# Patient Record
Sex: Female | Born: 1945 | Race: White | Hispanic: No | Marital: Married | Smoking: Former smoker
Health system: Southern US, Community
[De-identification: ages and names within clinical notes are randomized; demographics above are authoritative.]

## PROBLEM LIST (undated history)

## (undated) DIAGNOSIS — E785 Hyperlipidemia, unspecified: Secondary | ICD-10-CM

## (undated) DIAGNOSIS — F1020 Alcohol dependence, uncomplicated: Secondary | ICD-10-CM

## (undated) DIAGNOSIS — E039 Hypothyroidism, unspecified: Secondary | ICD-10-CM

## (undated) DIAGNOSIS — F32A Depression, unspecified: Secondary | ICD-10-CM

## (undated) DIAGNOSIS — I10 Essential (primary) hypertension: Secondary | ICD-10-CM

## (undated) DIAGNOSIS — J4530 Mild persistent asthma, uncomplicated: Secondary | ICD-10-CM

## (undated) DIAGNOSIS — IMO0001 Reserved for inherently not codable concepts without codable children: Secondary | ICD-10-CM

## (undated) DIAGNOSIS — J189 Pneumonia, unspecified organism: Secondary | ICD-10-CM

## (undated) DIAGNOSIS — E663 Overweight: Secondary | ICD-10-CM

## (undated) DIAGNOSIS — F329 Major depressive disorder, single episode, unspecified: Secondary | ICD-10-CM

## (undated) DIAGNOSIS — D509 Iron deficiency anemia, unspecified: Secondary | ICD-10-CM

## (undated) DIAGNOSIS — F419 Anxiety disorder, unspecified: Secondary | ICD-10-CM

## (undated) HISTORY — DX: Overweight: E66.3

## (undated) HISTORY — DX: Mild persistent asthma, uncomplicated: J45.30

## (undated) HISTORY — DX: Iron deficiency anemia, unspecified: D50.9

## (undated) HISTORY — DX: Essential (primary) hypertension: I10

## (undated) HISTORY — DX: Hyperlipidemia, unspecified: E78.5

---

## 1968-11-11 HISTORY — PX: BREAST LUMPECTOMY: SHX2

## 1992-11-11 DIAGNOSIS — J189 Pneumonia, unspecified organism: Secondary | ICD-10-CM

## 1992-11-11 HISTORY — DX: Pneumonia, unspecified organism: J18.9

## 1998-02-27 ENCOUNTER — Encounter: Admission: RE | Admit: 1998-02-27 | Discharge: 1998-02-27 | Payer: Self-pay | Admitting: Family Medicine

## 1998-03-27 ENCOUNTER — Encounter: Admission: RE | Admit: 1998-03-27 | Discharge: 1998-03-27 | Payer: Self-pay | Admitting: Family Medicine

## 1998-04-11 ENCOUNTER — Encounter: Admission: RE | Admit: 1998-04-11 | Discharge: 1998-04-11 | Payer: Self-pay | Admitting: Family Medicine

## 1998-04-14 ENCOUNTER — Encounter: Admission: RE | Admit: 1998-04-14 | Discharge: 1998-04-14 | Payer: Self-pay | Admitting: Family Medicine

## 1998-08-16 ENCOUNTER — Encounter: Admission: RE | Admit: 1998-08-16 | Discharge: 1998-08-16 | Payer: Self-pay | Admitting: Family Medicine

## 1999-01-17 ENCOUNTER — Encounter: Admission: RE | Admit: 1999-01-17 | Discharge: 1999-01-17 | Payer: Self-pay | Admitting: Family Medicine

## 1999-01-26 ENCOUNTER — Encounter: Admission: RE | Admit: 1999-01-26 | Discharge: 1999-01-26 | Payer: Self-pay | Admitting: Family Medicine

## 1999-02-20 ENCOUNTER — Encounter: Admission: RE | Admit: 1999-02-20 | Discharge: 1999-02-20 | Payer: Self-pay | Admitting: Family Medicine

## 1999-03-23 ENCOUNTER — Encounter: Admission: RE | Admit: 1999-03-23 | Discharge: 1999-03-23 | Payer: Self-pay | Admitting: Family Medicine

## 1999-05-07 ENCOUNTER — Encounter: Admission: RE | Admit: 1999-05-07 | Discharge: 1999-05-07 | Payer: Self-pay | Admitting: Family Medicine

## 2000-02-26 ENCOUNTER — Encounter: Admission: RE | Admit: 2000-02-26 | Discharge: 2000-02-26 | Payer: Self-pay

## 2000-05-08 ENCOUNTER — Encounter: Admission: RE | Admit: 2000-05-08 | Discharge: 2000-05-08 | Payer: Self-pay | Admitting: Family Medicine

## 2000-09-17 ENCOUNTER — Encounter: Admission: RE | Admit: 2000-09-17 | Discharge: 2000-09-17 | Payer: Self-pay | Admitting: Family Medicine

## 2000-12-05 ENCOUNTER — Encounter: Admission: RE | Admit: 2000-12-05 | Discharge: 2000-12-05 | Payer: Self-pay | Admitting: Family Medicine

## 2000-12-12 ENCOUNTER — Encounter: Admission: RE | Admit: 2000-12-12 | Discharge: 2000-12-12 | Payer: Self-pay | Admitting: Family Medicine

## 2001-05-28 ENCOUNTER — Encounter: Admission: RE | Admit: 2001-05-28 | Discharge: 2001-05-28 | Payer: Self-pay | Admitting: Family Medicine

## 2001-06-11 ENCOUNTER — Encounter: Admission: RE | Admit: 2001-06-11 | Discharge: 2001-06-11 | Payer: Self-pay | Admitting: Family Medicine

## 2001-07-03 ENCOUNTER — Encounter: Admission: RE | Admit: 2001-07-03 | Discharge: 2001-07-03 | Payer: Self-pay | Admitting: Family Medicine

## 2001-07-06 ENCOUNTER — Encounter: Admission: RE | Admit: 2001-07-06 | Discharge: 2001-07-06 | Payer: Self-pay | Admitting: Family Medicine

## 2002-03-29 ENCOUNTER — Encounter: Admission: RE | Admit: 2002-03-29 | Discharge: 2002-03-29 | Payer: Self-pay | Admitting: Family Medicine

## 2003-08-12 ENCOUNTER — Encounter (INDEPENDENT_AMBULATORY_CARE_PROVIDER_SITE_OTHER): Payer: Self-pay | Admitting: *Deleted

## 2003-08-12 LAB — CONVERTED CEMR LAB

## 2003-08-17 ENCOUNTER — Encounter: Admission: RE | Admit: 2003-08-17 | Discharge: 2003-08-17 | Payer: Self-pay | Admitting: Family Medicine

## 2003-08-22 ENCOUNTER — Encounter: Admission: RE | Admit: 2003-08-22 | Discharge: 2003-08-22 | Payer: Self-pay | Admitting: Sports Medicine

## 2003-08-22 ENCOUNTER — Encounter: Payer: Self-pay | Admitting: Sports Medicine

## 2004-02-06 ENCOUNTER — Encounter: Admission: RE | Admit: 2004-02-06 | Discharge: 2004-02-06 | Payer: Self-pay | Admitting: Family Medicine

## 2005-03-21 ENCOUNTER — Ambulatory Visit: Payer: Self-pay | Admitting: Family Medicine

## 2006-04-04 ENCOUNTER — Ambulatory Visit: Payer: Self-pay | Admitting: Family Medicine

## 2007-01-09 ENCOUNTER — Encounter (INDEPENDENT_AMBULATORY_CARE_PROVIDER_SITE_OTHER): Payer: Self-pay | Admitting: *Deleted

## 2007-04-23 ENCOUNTER — Telehealth: Payer: Self-pay | Admitting: *Deleted

## 2007-05-12 ENCOUNTER — Encounter: Payer: Self-pay | Admitting: Family Medicine

## 2007-05-12 ENCOUNTER — Ambulatory Visit: Payer: Self-pay | Admitting: Family Medicine

## 2007-05-12 DIAGNOSIS — J45909 Unspecified asthma, uncomplicated: Secondary | ICD-10-CM | POA: Insufficient documentation

## 2007-05-12 DIAGNOSIS — I10 Essential (primary) hypertension: Secondary | ICD-10-CM

## 2007-05-12 LAB — CONVERTED CEMR LAB
ALT: 35 units/L (ref 0–35)
CO2: 29 meq/L (ref 19–32)
Cholesterol: 278 mg/dL — ABNORMAL HIGH (ref 0–200)
Creatinine, Ser: 0.76 mg/dL (ref 0.40–1.20)
Glucose, Bld: 95 mg/dL (ref 70–99)
Total Bilirubin: 0.7 mg/dL (ref 0.3–1.2)
Total CHOL/HDL Ratio: 5.1
Triglycerides: 225 mg/dL — ABNORMAL HIGH (ref <150)
VLDL: 45 mg/dL — ABNORMAL HIGH (ref 0–40)

## 2007-05-14 ENCOUNTER — Telehealth: Payer: Self-pay | Admitting: Family Medicine

## 2007-06-03 ENCOUNTER — Ambulatory Visit: Payer: Self-pay | Admitting: Family Medicine

## 2007-06-03 DIAGNOSIS — E785 Hyperlipidemia, unspecified: Secondary | ICD-10-CM | POA: Insufficient documentation

## 2007-08-28 ENCOUNTER — Telehealth: Payer: Self-pay | Admitting: Family Medicine

## 2007-09-02 ENCOUNTER — Ambulatory Visit: Payer: Self-pay | Admitting: Family Medicine

## 2007-09-02 ENCOUNTER — Encounter: Payer: Self-pay | Admitting: Family Medicine

## 2007-09-02 LAB — CONVERTED CEMR LAB
ALT: 29 units/L (ref 0–35)
AST: 34 units/L (ref 0–37)
Albumin: 4.7 g/dL (ref 3.5–5.2)

## 2007-09-03 ENCOUNTER — Encounter: Payer: Self-pay | Admitting: Family Medicine

## 2007-10-16 ENCOUNTER — Ambulatory Visit: Payer: Self-pay | Admitting: Family Medicine

## 2007-10-16 DIAGNOSIS — J309 Allergic rhinitis, unspecified: Secondary | ICD-10-CM | POA: Insufficient documentation

## 2008-05-31 ENCOUNTER — Encounter: Payer: Self-pay | Admitting: Family Medicine

## 2008-05-31 ENCOUNTER — Ambulatory Visit: Payer: Self-pay | Admitting: Family Medicine

## 2008-05-31 LAB — CONVERTED CEMR LAB

## 2008-06-07 ENCOUNTER — Encounter: Payer: Self-pay | Admitting: Family Medicine

## 2008-06-15 ENCOUNTER — Ambulatory Visit: Payer: Self-pay | Admitting: Family Medicine

## 2008-06-15 ENCOUNTER — Encounter: Payer: Self-pay | Admitting: Family Medicine

## 2008-06-15 LAB — CONVERTED CEMR LAB
ALT: 39 units/L — ABNORMAL HIGH (ref 0–35)
AST: 37 units/L (ref 0–37)
Cholesterol: 244 mg/dL — ABNORMAL HIGH (ref 0–200)
Creatinine, Ser: 0.64 mg/dL (ref 0.40–1.20)
Total Bilirubin: 0.4 mg/dL (ref 0.3–1.2)
Total CHOL/HDL Ratio: 3.4
VLDL: 36 mg/dL (ref 0–40)

## 2008-06-20 ENCOUNTER — Telehealth: Payer: Self-pay | Admitting: Family Medicine

## 2008-10-03 ENCOUNTER — Telehealth: Payer: Self-pay | Admitting: *Deleted

## 2009-01-02 ENCOUNTER — Ambulatory Visit: Payer: Self-pay | Admitting: Family Medicine

## 2009-01-02 ENCOUNTER — Encounter: Payer: Self-pay | Admitting: Family Medicine

## 2009-01-04 LAB — CONVERTED CEMR LAB
ALT: 29 units/L (ref 0–35)
Albumin: 4.7 g/dL (ref 3.5–5.2)
Bilirubin, Direct: 0.1 mg/dL (ref 0.0–0.3)
Direct LDL: 134 mg/dL — ABNORMAL HIGH
Total Bilirubin: 0.5 mg/dL (ref 0.3–1.2)

## 2009-03-06 ENCOUNTER — Ambulatory Visit: Payer: Self-pay | Admitting: Family Medicine

## 2009-03-06 ENCOUNTER — Encounter: Admission: RE | Admit: 2009-03-06 | Discharge: 2009-03-06 | Payer: Self-pay | Admitting: Family Medicine

## 2009-03-08 ENCOUNTER — Telehealth: Payer: Self-pay | Admitting: Family Medicine

## 2009-03-08 ENCOUNTER — Encounter: Payer: Self-pay | Admitting: Family Medicine

## 2009-03-08 ENCOUNTER — Ambulatory Visit: Payer: Self-pay | Admitting: Family Medicine

## 2009-03-17 ENCOUNTER — Encounter: Admission: RE | Admit: 2009-03-17 | Discharge: 2009-03-17 | Payer: Self-pay | Admitting: Family Medicine

## 2009-03-21 ENCOUNTER — Encounter: Payer: Self-pay | Admitting: Family Medicine

## 2009-04-11 ENCOUNTER — Encounter: Payer: Self-pay | Admitting: Family Medicine

## 2009-05-09 ENCOUNTER — Encounter: Payer: Self-pay | Admitting: Family Medicine

## 2009-08-15 ENCOUNTER — Telehealth: Payer: Self-pay | Admitting: *Deleted

## 2009-10-16 ENCOUNTER — Encounter: Payer: Self-pay | Admitting: Family Medicine

## 2009-10-16 ENCOUNTER — Ambulatory Visit: Payer: Self-pay | Admitting: Family Medicine

## 2009-10-16 DIAGNOSIS — E663 Overweight: Secondary | ICD-10-CM | POA: Insufficient documentation

## 2009-10-16 LAB — CONVERTED CEMR LAB
ALT: 22 units/L (ref 0–35)
Albumin: 5 g/dL (ref 3.5–5.2)
CO2: 22 meq/L (ref 19–32)
Calcium: 10 mg/dL (ref 8.4–10.5)
Chloride: 101 meq/L (ref 96–112)
Cholesterol: 253 mg/dL — ABNORMAL HIGH (ref 0–200)
Creatinine, Ser: 0.84 mg/dL (ref 0.40–1.20)
HCT: 38.3 % (ref 36.0–46.0)
Platelets: 266 10*3/uL (ref 150–400)
Potassium: 5.9 meq/L — ABNORMAL HIGH (ref 3.5–5.3)
Total CHOL/HDL Ratio: 3.2
Total Protein: 7.7 g/dL (ref 6.0–8.3)
WBC: 7.2 10*3/uL (ref 4.0–10.5)

## 2009-10-17 ENCOUNTER — Telehealth: Payer: Self-pay | Admitting: Family Medicine

## 2010-03-19 ENCOUNTER — Telehealth: Payer: Self-pay | Admitting: Family Medicine

## 2010-08-26 ENCOUNTER — Encounter: Payer: Self-pay | Admitting: Family Medicine

## 2010-10-26 ENCOUNTER — Encounter
Admission: RE | Admit: 2010-10-26 | Discharge: 2010-10-26 | Payer: Self-pay | Source: Home / Self Care | Attending: Family Medicine | Admitting: Family Medicine

## 2010-11-26 ENCOUNTER — Ambulatory Visit: Admission: RE | Admit: 2010-11-26 | Discharge: 2010-11-26 | Payer: Self-pay | Source: Home / Self Care

## 2010-11-26 ENCOUNTER — Encounter: Payer: Self-pay | Admitting: Family Medicine

## 2010-11-26 LAB — CONVERTED CEMR LAB
ALT: 24 units/L (ref 0–35)
Albumin: 4.8 g/dL (ref 3.5–5.2)
CO2: 25 meq/L (ref 19–32)
Calcium: 9.2 mg/dL (ref 8.4–10.5)
Chloride: 102 meq/L (ref 96–112)
Cholesterol: 268 mg/dL — ABNORMAL HIGH (ref 0–200)
Glucose, Bld: 80 mg/dL (ref 70–99)
Platelets: 217 10*3/uL (ref 150–400)
Potassium: 5 meq/L (ref 3.5–5.3)
RBC: 3.58 M/uL — ABNORMAL LOW (ref 3.87–5.11)
Sodium: 137 meq/L (ref 135–145)
Total CHOL/HDL Ratio: 5.1
Total Protein: 7.1 g/dL (ref 6.0–8.3)
Triglycerides: 379 mg/dL — ABNORMAL HIGH (ref <150)
VLDL: 76 mg/dL — ABNORMAL HIGH (ref 0–40)
WBC: 5.2 10*3/uL (ref 4.0–10.5)

## 2010-11-27 ENCOUNTER — Encounter: Payer: Self-pay | Admitting: Family Medicine

## 2010-11-27 DIAGNOSIS — D509 Iron deficiency anemia, unspecified: Secondary | ICD-10-CM | POA: Insufficient documentation

## 2010-12-13 NOTE — Assessment & Plan Note (Signed)
Summary: cpp/eo   Vital Signs:  Patient profile:   65 year old female Height:      60 inches Weight:      128 pounds BMI:     25.09 Temp:     98.3 degrees F oral Pulse rate:   77 / minute BP sitting:   143 / 79  (right arm) Cuff size:   regular  Vitals Entered By: Tessie Fass CMA (November 26, 2010 9:26 AM) CC: CPE Is Patient Diabetic? No Pain Assessment Patient in pain? no        Primary Care Provider:  Kataleyah Carducci MD  CC:  CPE.  History of Present Illness: 65 y/o F here for CPE.   HYPERTENSION Disease Monitoring Blood pressure range: 150s-160s-180s Pt states that BPs are always elevated when she comes to doctor's office, but when she checks at home/store it is always better.  Medications: Lisinopril 40mg , HCTZ 25mg  daily, Coreg 12.5mg   two times a day  Compliance: Lightheadedness: Edema: Chest pain: Dyspnea: Exercise: yes, almost daily  Salt restriction:yes    HYPERLIPIDEMIA: Med: Pravastatin 40mg   Compliance: yes Prob taking med:no Muscle aches:no Abd  pain:no   OBESITY BMI: 26.09   Weight difference since last OV:  -4.8 lbs  Exercise:  yes, walking       How often:     almost everyday  Diet: trying to do low fat diet , eating lots of vegetables   Habits & Providers  Alcohol-Tobacco-Diet     Alcohol drinks/day: <1     Alcohol Counseling: not indicated; patient does not drink     Tobacco Status: quit     Tobacco Counseling: to remain off tobacco products  Exercise-Depression-Behavior     Does Patient Exercise: yes     Exercise Counseling: not indicated; exercise is adequate     Type of exercise: walking      Times/week: 5  Current Medications (verified): 1)  Pravastatin Sodium 40 Mg  Tabs (Pravastatin Sodium) .Marland Kitchen.. 1 Tab By Mouth At Bedtime. 2)  Proair Hfa 108 (90 Base) Mcg/act Aers (Albuterol Sulfate) .... 2 Puffs Inhaled Every 4 Hours As Needed Shortness of Breath.  Dispense Generic. 3)  Coreg 12.5 Mg  Tabs (Carvedilol) .Marland Kitchen.. 1 Tab By Mouth Two  Times A Day. 4)  Lisinopril-Hydrochlorothiazide 20-12.5 Mg  Tabs (Lisinopril-Hydrochlorothiazide) .... 2 Tabs By Mouth Daily 5)  Pulmicort Flexhaler 90 Mcg/act  Aepb (Budesonide) .Marland Kitchen.. 1 Puff Inhaled Two Times A Day Regularly 6)  Multivitamins  Caps (Multiple Vitamin) .Marland Kitchen.. 1 Tab By Mouth Daily 7)  Calcium 1500 Mg Tabs (Calcium Carbonate) .Marland Kitchen.. 1 Tab By Mouth Daily 8)  Fish Oil 1200 Mg Caps (Omega-3 Fatty Acids) .... 2 Tab Daily 9)  Aspirin 81 Mg Chew (Aspirin) .Marland Kitchen.. 1 Tab Daily 10)  Co-Enzyme Q-10 10 Mg Caps (Coenzyme Q10) .Marland Kitchen.. 1 Tab By Mouth Daily  Allergies (verified): 1)  Norvasc  Past History:  Past Medical History: Last updated: 10/16/2009 Recovered alcoholic 1994 HTN Hyperlipidemia Eye doctor Dr Pearlean Brownie ([facility], last appt 09/2009, FINE) Dentist (Dr Lelon Perla, last visit 02/2009, needs crown) Norvasc-->LE edema  Family History: Last updated: 06/03/2007 2Bro:  age 70 and 63 with HTN, F:  Lung CA, died age 61, M:  HTN, living age 74, No colon, breast, or ovarian CA,MI or DM, Sis:  Healthy.  No early CV disease.  Social History: Last updated: 10/16/2009 Lives with husband of 30+ years in [place], one child, 2 grandchildren.  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  H/O heavy EtOH use from 1990-94, now only occassional and under control.  Quit smoking in 1996, never used street drugs.  Healthcare POA: daughter, Rulon Eisenmenger Has Living Will: DNR  Risk Factors: Alcohol Use: <1 (11/26/2010) Exercise: yes (11/26/2010)  Risk Factors: Smoking Status: quit (11/26/2010) Packs/Day: 2.0 (10/16/2009)  Review of Systems       The patient complains of weight loss.  The patient denies anorexia, fever, hoarseness, chest pain, syncope, dyspnea on exertion, peripheral edema, prolonged cough, headaches, abdominal pain, melena, hematochezia, hematuria, incontinence, difficulty walking, unusual weight change, and enlarged lymph  nodes.    Physical Exam  General:  Well-developed,well-nourished,in no acute distress; alert,appropriate and cooperative throughout examination. Vitals reviewed.  Head:  normocephalic and atraumatic.   Mouth:  Oral mucosa and oropharynx without lesions or exudates.  Teeth in good repair. Breasts:  No mass, nodules, thickening, tenderness, bulging, retraction, inflamation, nipple discharge or skin changes noted.   Lungs:  Normal respiratory effort, chest expands symmetrically. Lungs are clear to auscultation, no crackles or wheezes. Heart:  Normal rate and regular rhythm. S1 and S2 normal without gallop, murmur, click, rub or other extra sounds. no bruit.  Abdomen:  Bowel sounds positive,abdomen soft and non-tender without masses, organomegaly or hernias noted. Genitalia:  Normal introitus for age, no external lesions, no vaginal discharge, mucosa pink and moist, no vaginal or cervical lesions, no vaginal atrophy, no friaility or hemorrhage, normal uterus size and position, no adnexal masses or tenderness Msk:  No deformity or scoliosis noted of thoracic or lumbar spine.   Pulses:  +2 bilaterally  Extremities:  No clubbing, cyanosis, edema, or deformity noted with normal full range of motion of all joints.   Neurologic:  No cranial nerve deficits noted. Station and gait are normal. Plantar reflexes are down-going bilaterally. DTRs are symmetrical throughout. Sensory, motor and coordinative functions appear intact. Skin:  Intact without suspicious lesions or rashes Cervical Nodes:  No lymphadenopathy noted Axillary Nodes:  No palpable lymphadenopathy Inguinal Nodes:  No significant adenopathy   Impression & Recommendations:  Problem # 1:  PHYSICAL EXAMINATION (ICD-V70.0) Assessment Unchanged Pt doing well.  No complaints. She declined referral for colonscopy.  Risk and benefits discussed with pt.  Up to date on MMG.  Pap done today.  Discussed last pap likely will be next pap in 3 yrs, and  will then be aged out.   Flu shot given in 09/2010 at pt's work place.   Orders: [facility] - Est  40-64 yrs (11914)  Problem # 2:  HYPERTENSION, BENIGN ESSENTIAL (ICD-401.1) Assessment: Improved BP 143/79  elevated, but improved from previous of 160s-180s, and almost at goal of 140/90.  Will not make changes today.   Her updated medication list for this problem includes:    Coreg 12.5 Mg Tabs (Carvedilol) .Marland Kitchen... 1 tab by mouth two times a day.    Lisinopril-hydrochlorothiazide 20-12.5 Mg Tabs (Lisinopril-hydrochlorothiazide) .Marland Kitchen... 2 tabs by mouth daily  Orders: Comp Met-[facility] ([id number]) CBC-[facility] ([id number]) [facility] - Est  40-64 yrs ([id number])  Problem # 3:  HYPERLIPIDEMIA (ICD-272.4) Assessment: Unchanged Will check FLP today, as pt is fasting.  Last LDL was 155, with goal of <952.  Pt taking pravastatin 40mg , will consider switching to Lipitor (now that it is generic) if LDL is still not at goal.  The following medications were removed from the medication list:    Simvastatin 80 Mg Tabs (Simvastatin) .Marland Kitchen... 1 tab by  mouth at bedtime for cholesterol Her updated medication list for this problem includes:    Pravastatin Sodium 40 Mg Tabs (Pravastatin sodium) .Marland Kitchen... 1 tab by mouth at bedtime.  Orders: Comp Met-[facility] [id number]) Lipid-[facility] [id number]) [facility] - Est  40-64 yrs ([id number])  Complete Medication List: 1)  Pravastatin Sodium 40 Mg Tabs (Pravastatin sodium) .Marland Kitchen.. 1 tab by mouth at bedtime. 2)  Proair Hfa 108 (90 Base) Mcg/act Aers (Albuterol sulfate) .... 2 puffs inhaled every 4 hours as needed shortness of breath.  dispense generic. 3)  Coreg 12.5 Mg Tabs (Carvedilol) .Marland Kitchen.. 1 tab by mouth two times a day. 4)  Lisinopril-hydrochlorothiazide 20-12.5 Mg Tabs (Lisinopril-hydrochlorothiazide) .... 2 tabs by mouth daily 5)  Pulmicort Flexhaler 90 Mcg/act Aepb (Budesonide) .Marland Kitchen.. 1 puff inhaled two times a day regularly 6)  Multivitamins Caps (Multiple vitamin) .Marland Kitchen.. 1 tab by mouth daily 7)  Calcium 1500 Mg  Tabs (Calcium carbonate) .Marland Kitchen.. 1 tab by mouth daily 8)  Fish Oil 1200 Mg Caps (Omega-3 fatty acids) .... 2 tab daily 9)  Aspirin 81 Mg Chew (Aspirin) .Marland Kitchen.. 1 tab daily 10)  Co-enzyme Q-10 10 Mg Caps (Coenzyme q10) .Marland Kitchen.. 1 tab by mouth daily  Other Orders: Pap Smear-[facility] ([id number])  Patient Instructions: 1)  Please schedule a follow-up appointment in 6 months for chronic medical issues. 2)  It is important that you exercise reguarly at least 20 minutes 5 times a week. If you develop chest pain, have severe difficulty breathing, or feel very tired, stop exercising immediately and seek medical attention.  3)  Congratulation on your weight loss.  Prescriptions: PULMICORT FLEXHALER 90 MCG/ACT  AEPB (BUDESONIDE) 1 puff inhaled two times a day regularly  #1 x 2   Entered and Authorized by:   Angeline Slim MD   Signed by:   Angeline Slim MD on 11/26/2010   Method used:   Electronically to        [facility] (retail)       [address]       [place], [place]  [address]       Ph: [phone]       Fax: [phone]   RxID:   [id number] LISINOPRIL-HYDROCHLOROTHIAZIDE 20-12.5 MG  TABS (LISINOPRIL-HYDROCHLOROTHIAZIDE) 2 tabs by mouth daily  #180 x 3   Entered and Authorized by:   Angeline Slim MD   Signed by:   Angeline Slim MD on 11/26/2010   Method used:   Electronically to        [facility] (retail)       [address]       [place], [place]  [address]       Ph: [phone]       Fax: [phone]   RxID:   [id number] COREG 12.5 MG  TABS (CARVEDILOL) 1 tab by mouth two times a day.  #180 x 3   Entered and Authorized by:   Angeline Slim MD   Signed by:   Angeline Slim MD on 11/26/2010   Method used:   Electronically to        [facility] (retail)       [address]       [place], [place]  [address]       Ph: [phone]       Fax: [phone]   RxID:  [id number] PROAIR HFA 108 (90 BASE) MCG/ACT AERS (ALBUTEROL  SULFATE) 2 puffs inhaled every 4 hours as needed shortness of breath.  Dispense generic.  #1 x 2   Entered and Authorized by:   Angeline Slim MD   Signed by:   Angeline Slim MD on 11/26/2010   Method used:   Electronically to        [facility] (retail)       [address]       [place], [place]  [address]       Ph: [phone]       Fax: [phone]   RxID:   [id number] PRAVASTATIN SODIUM 40 MG  TABS (PRAVASTATIN SODIUM) 1 tab by mouth at bedtime.  #90 x 3   Entered and Authorized by:   Angeline Slim MD   Signed by:   Angeline Slim MD on 11/26/2010   Method used:   Electronically to        [facility] (retail)       [address]       [place], [place]  [address]       Ph: [phone]       Fax: [phone]   RxID:   [id number]    Orders Added: 1)  Pap Smear-[facility] [[id number]] 2)  Comp Met-[facility] [[id number]] 3)  CBC-[facility] [[id number]] 4)  Lipid-[facility] [[id number]] 5)  [facility] - Est  40-64 yrs [[id number]]      Prevention & Chronic Care Immunizations   Influenza vaccine: Not documented   Influenza vaccine due: Not Indicated    Tetanus booster: 01/02/2009: given   Tetanus booster due: 01/02/2019    Pneumococcal vaccine: Done.  (12/13/1999)   Pneumococcal vaccine due: None    H. zoster vaccine: Not documented  Colorectal Screening   Hemoccult: Done.  (05/11/2001)   Hemoccult action/deferral: Ordered  (10/16/2009)   Hemoccult due: Not Indicated    Colonoscopy: Not documented   Colonoscopy action/deferral: Refused  (10/16/2009)   Colonoscopy due: 10/16/2009  Other Screening   Pap smear: Normal  (06/07/2008)   Pap smear action/deferral: Deferred-3 yr interval  (10/16/2009)   Pap smear due: 06/08/2011    Mammogram: ASSESSMENT: Negative - BI-RADS 1^MM DIGITAL SCREENING  (10/26/2010)   Mammogram action/deferral: Ordered  (10/16/2009)   Mammogram due: 08/11/2004    DXA bone density scan: Not documented   Smoking status: quit   (11/26/2010)  Lipids   Total Cholesterol: 253  (10/16/2009)   LDL: 155  (10/16/2009)   LDL Direct: 134  (01/02/2009)   HDL: 78  (10/16/2009)   Triglycerides: 98  (10/16/2009)    SGOT (AST): 27  (10/16/2009)   SGPT (ALT): 22  (10/16/2009) CMP ordered    Alkaline phosphatase: 84  (10/16/2009)   Total bilirubin: 0.5  (10/16/2009)    Lipid flowsheet reviewed?: Yes   Progress toward LDL goal: Unchanged  Hypertension   Last Blood Pressure: 143 / 79  (11/26/2010)   Serum creatinine: 0.84  (10/16/2009)   Serum potassium 5.9  (10/16/2009) CMP ordered     Hypertension flowsheet reviewed?: Yes   Progress toward BP goal: Improved  Self-Management Support :   Personal Goals (by the next clinic visit) :      Personal blood pressure goal: 140/90  (11/26/2010)     Personal LDL goal: 130  (10/16/2009)    Hypertension self-management support: Written self-care plan  (10/16/2009)  Lipid self-management support: Written self-care plan  (10/16/2009)      Appended Document: B12 AND FOLATE ADDED B12 AND FOLATE ADDED   Clinical Lists Changes  Problems: Added new problem of UNSPECIFIED IRON DEFICIENCY ANEMIA (ICD-280.9) Orders: Added new Test order of B12-[facility] [phone]) - Signed Added new Test order of Folate-[facility] [id number]) - Signed

## 2010-12-13 NOTE — Progress Notes (Signed)
Summary: refill  Phone Note Refill Request Call back at [facility] Phone [phone] Call back at Work Phone [phone] Message from:  Patient  Refills Requested: Medication #1:  PROAIR HFA 108 (90 BASE) MCG/ACT AERS 2 puffs inhaled every 4 hours as needed shortness of breath.  Dispense generic. [facility] [place]  Initial call taken by: De Nurse,  Mar 19, 2010 3:24 PM    Prescriptions: PROAIR HFA 108 (90 BASE) MCG/ACT AERS (ALBUTEROL SULFATE) 2 puffs inhaled every 4 hours as needed shortness of breath.  Dispense generic.  #1 x 3   Entered and Authorized by:   Angeline Slim MD   Signed by:   Angeline Slim MD on 03/19/2010   Method used:   Electronically to        [facility] (retail)       [address]       [place], [place]  [address]       Ph: [phone]       Fax: [phone]   RxID:   [id number]

## 2010-12-13 NOTE — Miscellaneous (Signed)
Summary: Asthma persistent  Clinical Lists Changes  Problems: Removed problem of OTHER SCREENING MAMMOGRAM (ICD-V76.12) Removed problem of FRACTURE, TOE (ICD-826.0) Removed problem of INJURY OTHER&UNSPECIFIED KNEE LEG ANKLE&FOOT (ICD-959.7) Removed problem of ANTIHYPERLIPIDEMIC USE, LONG TERM (ICD-V58.69) Changed problem from ASTHMA, INTRINSIC NOS (ICD-493.10) to ASTHMA, PERSISTENT (ICD-493.90)

## 2011-02-13 ENCOUNTER — Inpatient Hospital Stay (HOSPITAL_COMMUNITY)
Admission: EM | Admit: 2011-02-13 | Discharge: 2011-02-14 | DRG: 096 | Disposition: A | Discharge status: Home / Self Care | Payer: BC Managed Care – PPO | Source: Other Acute Inpatient Hospital | Attending: Family Medicine | Admitting: Family Medicine

## 2011-02-13 ENCOUNTER — Emergency Department (HOSPITAL_COMMUNITY): Payer: BC Managed Care – PPO

## 2011-02-13 ENCOUNTER — Emergency Department (HOSPITAL_COMMUNITY)
Admission: EM | Admit: 2011-02-13 | Discharge: 2011-02-13 | Disposition: A | Discharge status: Other Acute Inpatient Hospital | Payer: BC Managed Care – PPO | Source: Home / Self Care | Attending: Emergency Medicine | Admitting: Emergency Medicine

## 2011-02-13 ENCOUNTER — Encounter: Payer: Self-pay | Admitting: Family Medicine

## 2011-02-13 DIAGNOSIS — E7800 Pure hypercholesterolemia, unspecified: Secondary | ICD-10-CM | POA: Diagnosis present

## 2011-02-13 DIAGNOSIS — R0902 Hypoxemia: Secondary | ICD-10-CM | POA: Diagnosis present

## 2011-02-13 DIAGNOSIS — Z87891 Personal history of nicotine dependence: Secondary | ICD-10-CM

## 2011-02-13 DIAGNOSIS — E785 Hyperlipidemia, unspecified: Secondary | ICD-10-CM | POA: Diagnosis present

## 2011-02-13 DIAGNOSIS — J069 Acute upper respiratory infection, unspecified: Secondary | ICD-10-CM | POA: Diagnosis present

## 2011-02-13 DIAGNOSIS — I10 Essential (primary) hypertension: Secondary | ICD-10-CM | POA: Diagnosis present

## 2011-02-13 DIAGNOSIS — J45901 Unspecified asthma with (acute) exacerbation: Secondary | ICD-10-CM

## 2011-02-13 DIAGNOSIS — E875 Hyperkalemia: Secondary | ICD-10-CM | POA: Diagnosis present

## 2011-02-13 DIAGNOSIS — Z79899 Other long term (current) drug therapy: Secondary | ICD-10-CM

## 2011-02-13 DIAGNOSIS — D509 Iron deficiency anemia, unspecified: Secondary | ICD-10-CM | POA: Diagnosis present

## 2011-02-13 DIAGNOSIS — R0602 Shortness of breath: Secondary | ICD-10-CM | POA: Insufficient documentation

## 2011-02-13 DIAGNOSIS — B9789 Other viral agents as the cause of diseases classified elsewhere: Secondary | ICD-10-CM | POA: Diagnosis present

## 2011-02-13 DIAGNOSIS — R0682 Tachypnea, not elsewhere classified: Secondary | ICD-10-CM | POA: Insufficient documentation

## 2011-02-13 DIAGNOSIS — Z882 Allergy status to sulfonamides status: Secondary | ICD-10-CM

## 2011-02-13 DIAGNOSIS — J45909 Unspecified asthma, uncomplicated: Secondary | ICD-10-CM | POA: Insufficient documentation

## 2011-02-13 LAB — DIFFERENTIAL
Basophils Relative: 1 % (ref 0–1)
Eosinophils Absolute: 0.6 10*3/uL (ref 0.0–0.7)
Monocytes Absolute: 0.5 10*3/uL (ref 0.1–1.0)
Monocytes Relative: 5 % (ref 3–12)
Neutro Abs: 7.7 10*3/uL (ref 1.7–7.7)

## 2011-02-13 LAB — CBC
Hemoglobin: 11.8 g/dL — ABNORMAL LOW (ref 12.0–15.0)
MCH: 32.3 pg (ref 26.0–34.0)
MCHC: 32.6 g/dL (ref 30.0–36.0)

## 2011-02-13 LAB — BRAIN NATRIURETIC PEPTIDE: Pro B Natriuretic peptide (BNP): 48.3 pg/mL (ref 0.0–100.0)

## 2011-02-13 LAB — BASIC METABOLIC PANEL
BUN: 20 mg/dL (ref 6–23)
Chloride: 99 mEq/L (ref 96–112)
Potassium: 5.6 mEq/L — ABNORMAL HIGH (ref 3.5–5.1)

## 2011-02-13 LAB — D-DIMER, QUANTITATIVE: D-Dimer, Quant: 0.27 ug/mL-FEU (ref 0.00–0.48)

## 2011-02-13 LAB — POCT CARDIAC MARKERS

## 2011-02-13 NOTE — H&P (Signed)
Hospital Admission Note Date: 02/13/2011 at 11:45 PM  Patient name: Sabrina Powell Medical record number: [id number] Date of birth: [birth year] Age: 65 y.o. Gender: female PCP: TA,CAT, MD  Medical Service: FMTS  Attending physician: Dr. Jennette Kettle    Pager: Resident (R2/R3):     Pager: Resident (R1): Dr. Armen Pickup     Pager: [phone]  Chief Complaint: SOB/hypoxia.   History of Present Illness: [name] with known history of intermittent asthma and  previous tobaccos use (2 PPD x 25 years, quit 17 yrs ago) who presents with congestion and SOB x 4 days. The patient describes worsening congestion and SOB following a 2-3 week course of allergic rhinitis symptoms (eye irritation, runny nose, postnasal drip). She states that she started using her albuterol inhaler about every 2 hours yesterday. She usually only uses PRN for chest tightness and SOB. Her last asthma and hospital  exacerbation was in the early Spring 17 years ago. Her last exacerbation was complicated by pneumonia and she required hospitalization. Along with chest tightness and SOB she admits to cough productive of yellow sputum no blood. She denies wheezing,  CP, fever, HA, N/V/D.   ED Course: PT was afebrile, hypertensive (165/83), normal resp rate, normal HR, 100 % on 2 L [place]. She received albuterol 5 mg neb tx en route to ED via EMS. In ED she received Albuterol 10mg /Atrovent 1 mg neb treatment and prednisone 60 mg POx1. She had no wheezing on exam. O2 sats 97-90 on RA. O2 sats dropped to 89 when pt walked.   Current Outpatient Prescriptions  Medication Sig Dispense Refill  . albuterol (PROAIR HFA) 108 (90 BASE) MCG/ACT inhaler 2 puffs inhaled every 4 hours as needed shortness of breath.  Dispense generic.       Marland Kitchen aspirin (ASPIRIN CHILDRENS) 81 MG chewable tablet Chew 81 mg by mouth daily.        . Budesonide (PULMICORT FLEXHALER) 90 MCG/ACT inhaler Inhale 1 puff into the lungs 2 (two) times daily. regularly       . Calcium Carbonate 1500 MG  TABS daily.       . carvedilol (COREG) 12.5 MG tablet Take 12.5 mg by mouth 2 (two) times daily.        Marland Kitchen lisinopril-hydrochlorothiazide (PRINZIDE,ZESTORETIC) 20-12.5 MG per tablet Take 2 tablets by mouth daily.        . Multiple Vitamin (MULTIVITAMIN) capsule Take 1 capsule by mouth daily.        . Omega-3 Fatty Acids (FISH OIL) 1200 MG CAPS Take 2 tabs daily       . Pomegranate Extract 40 % POWD daily.       . pravastatin (PRAVACHOL) 40 MG tablet Take 40 mg by mouth at bedtime.        . simvastatin (ZOCOR) 80 MG tablet Take 80 mg by mouth at bedtime. For cholesterol         Allergies: Amlodipine besylate (LE swelling), Sulfa drugs (rash)  Past Medical History  Diagnosis Date  . HTN (hypertension)   . HLD (hyperlipidemia)   . Overweight   . Allergic rhinitis   . Asthma, mild persistent   . IDA (iron deficiency anemia)    Past Surgical History  Procedure Date  . Breast lumpectomy 1970    benign left breast mass   Family History  Problem Relation Age of Onset  . Hypertension Mother   . Cancer Father    History   Social History  . Marital Status: Married  Spouse Name: N/A    Number of Children: N/A  . Years of Education: N/A   Occupational History  . 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 Smoking status: Former Smoker -- 2.0 packs/day for 25 years    Types: Cigarettes    Quit date: 02/13/1994  . Smokeless tobacco: Not on file  . Alcohol Use: 1.0 oz/week    2 drink(s) per week  . Drug Use: No  . Sexually Active: Not on file   Other Topics Concern  . Not on file   Social History Narrative  . No narrative on file      Review of Systems: Constitutional: neg for fever, weight change, hot/cold flashes  HEENT: as per HPI CV: as per HPI Resp: as per HPI GI: negative GU: negative Endo: negative MSK: negative   Physical Exam: Vitals: T 97.9, BP 166/86, HR 66, RR 19, 93% on RA.   General Appearance:    Alert, cooperative, no  distress, appears stated age  Head:    Normocephalic, without obvious abnormality, atraumatic  Eyes:    Conjunctiva/corneas clear, EOMI     Nose:   Nares normal,  no drainage   Throat:   Lips, mucosa normal  Neck:   Supple, symmetrical, trachea midline, no adenopathy;        Lungs:     Clear to auscultation bilaterally, respirations unlabored  Chest Wall:    No tenderness or deformity   Heart:    Regular rate and rhythm, S1 and S2 normal, no murmur, rub   or gallop     Abdomen:     Soft, non-tender, bowel sounds active all four quadrants,    no masses, no organomegaly        Extremities:   Extremities normal, atraumatic, no cyanosis or edema  Pulses:   2+ and symmetric all extremities  Skin:   Skin color, texture, turgor normal, no rashes or lesions     Neurologic:   CNII-XII intact, normal strength, sensation  throughout    Lab results: Lab Results  Component Value Date   WBC 10.5 02/13/2011   HGB 11.8* 02/13/2011   HCT 36.2 02/13/2011   MCV 99.2 02/13/2011   PLT 211 02/13/2011     Chemistry      Component Value Date/Time   NA 133* 02/13/2011 1409   K 5.6* 02/13/2011 1409   CL 99 02/13/2011 1409   CO2 25 02/13/2011 1409   BUN 20 02/13/2011 1409   CREATININE 1.15 02/13/2011 1409      Component Value Date/Time   CALCIUM 8.8 02/13/2011 1409   ALKPHOS 70 11/26/2010 2149   AST 32 11/26/2010 2149   ALT 24 11/26/2010 2149   BILITOT 0.3 11/26/2010 2149     BNP: 48.3 D-dimer: 0.27 POC CE: WNL Imaging results:  CXR: Biapical pleural parenchymal scarring.  Lungs are otherwise clear. Other results: EKG: NSR, rate 60s.   Assessment & Plan by Problem: [name] with mild persistent  (worse symptoms during fall and spring) who presents with SOB and hypoxia.  1. SOB-secondary to asthma exacerbation due to viral URI/allergies. Also considered cardiac cause but unlikely given pt's lack of CP, normal EKG, CXR, BNP and POC CE. Will not cycle CE as pt has no CP or history of CAD. Plan to continue  continuous pulse ox to determine if pt requires supplemental O2 overnight. Will give Albuterol 5 mg INH with spacer, first  now (althought pt lung exam is clear now 10 hrs post-treatment). Will continue Prednisone for a 5 days course. Will continue pulmicort if pt through most of tomorrow. Will start oral PPI for GI prophylaxis since  Continuing steroids.   2. HTN- Pt with know HTN with elevated SBP (140-190s) during past clinic visits. Plan to continue home carvedilol and prinzide.   3. FEN/GI- Pt with slightly low Na, and slightly elevated K+. Has also had slightly elevated K+ in past (5 - 5.9). Unclear etiology. ? drug induced from ACE. Since the pt's EKG is normal and her potassium is within her normal range. WIll continue ACE. No need to repeat BMET. Regular diet.   4. DVT PPX- Heparin 5000 U SQ TID.   5. Dispo- anticipate short hospital course, if patient remains clinically stable. Plan to DC to home.

## 2011-02-14 ENCOUNTER — Encounter: Payer: Self-pay | Admitting: Family Medicine

## 2011-02-14 LAB — CARDIAC PANEL(CRET KIN+CKTOT+MB+TROPI)
CK, MB: 3.6 ng/mL (ref 0.3–4.0)
Relative Index: 2.8 — ABNORMAL HIGH (ref 0.0–2.5)
Troponin I: 0.02 ng/mL (ref 0.00–0.06)

## 2011-02-14 LAB — BASIC METABOLIC PANEL
BUN: 22 mg/dL (ref 6–23)
Calcium: 8.6 mg/dL (ref 8.4–10.5)
Creatinine, Ser: 1.2 mg/dL (ref 0.4–1.2)
GFR calc non Af Amer: 46 mL/min — ABNORMAL LOW (ref >60)

## 2011-02-19 ENCOUNTER — Ambulatory Visit (INDEPENDENT_AMBULATORY_CARE_PROVIDER_SITE_OTHER): Payer: BC Managed Care – PPO | Admitting: Family Medicine

## 2011-02-19 ENCOUNTER — Encounter: Payer: Self-pay | Admitting: Family Medicine

## 2011-02-19 VITALS — BP 179/90 | HR 97 | Temp 98.3°F | Ht 59.5 in | Wt 132.0 lb

## 2011-02-19 DIAGNOSIS — J309 Allergic rhinitis, unspecified: Secondary | ICD-10-CM

## 2011-02-19 MED ORDER — FLUTICASONE PROPIONATE 50 MCG/ACT NA SUSP
1.0000 | Freq: Every day | NASAL | Status: DC
Start: 2011-02-19 — End: 2011-12-23

## 2011-02-19 NOTE — Assessment & Plan Note (Addendum)
Symptoms of cough that is worse at night and watery eyes + rhinorrhea likely post nasal drip.  Pt already on zyrtec.  Will add Flonase.  No wheezing on exam.  **note: pt to call me on Fri or Mon if not feeling better and I may start her on atbx.  She is agreeable to plan.

## 2011-02-19 NOTE — Progress Notes (Signed)
  Subjective:    Patient ID: Sabrina Powell, female    DOB: Jun 23, 1946, 65 y.o.   MRN: [id number]  HPI Pt is here for hosp f/u for Asthma exacerbation.  The last time she had an exacerbation was 18 yrs ago.  She was sent home with total of 7 days of Prednisone (today is last day).  This past weekend she was at the beach and it was windy and now pt feels that she has bronchitis.  +cough with yellow sputum.  +nasal congestion. +ear pressure. -headache. +watery eyes.  -nausea. -vomiting. -fever. -chest pain.  -dyspnea.  -wheezing.  -sore throat. She has a history of allergic rhinitis and is on chronic zyrtec at home.    **She thinks that Ventolin brand worked better than generic albuterol.   Review of Systems  Per hpi  Objective:   Physical Exam  Constitutional: She appears well-developed and well-nourished. She appears distressed.  HENT:  Head: Normocephalic and atraumatic.  Right Ear: External ear normal.  Left Ear: External ear normal.  Mouth/Throat: Oropharynx is clear and moist.       +nasal erythema  Eyes:       Watery conjunctiva, no redness, no injection  Neck: Normal range of motion. Neck supple.  Cardiovascular: Normal rate, regular rhythm and normal heart sounds.   No murmur heard. Pulmonary/Chest: Effort normal and breath sounds normal. No respiratory distress. She has no wheezes. She exhibits no tenderness.  Lymphadenopathy:    She has no cervical adenopathy.          Assessment & Plan:

## 2011-02-25 NOTE — H&P (Addendum)
NAMEDESHANTA, LADY NO.:  [id number]  MEDICAL RECORD NO.:  [id number]           PATIENT TYPE:  E  LOCATION:  WLED                         FACILITY:  [facility]  PHYSICIAN:  Nestor Ramp, MD        DATE OF BIRTH:  [birth year]  DATE OF ADMISSION:  02/13/2011 DATE OF DISCHARGE:  02/13/2011                             HISTORY & PHYSICAL   PRIMARY CARE PHYSICIAN:  Dr. Angeline Slim in [facility].  CHIEF COMPLAINT:  Shortness of breath and hypoxia.  HISTORY OF PRESENT ILLNESS:  The patient is a 65 year old female with a known history of intermittent asthma and previous tobacco use (2 packs per day, times 25 years, quit 17 years ago), who presents with congestion and shortness of breath x4 days.  The patient describes worsening congestion and shortness of breath following a 2-3-week course of allergic rhinitis symptoms (eye irritation, runny nose, postnasal drip).  She states that she started using her albuterol inhaler about every 2 hours yesterday.  She only uses p.r.n. for chest tightness and shortness of breath.  Her last asthma exacerbation in hospital admission was in early spring, 17 years ago.  Her last exacerbation was complicated by pneumonia and she required hospitalization.  Along with chest tightness and shortness of breath, she admits to cough productive of yellow sputum, no blood.  She denies wheezing, chest pain, fever, headache, nausea, vomiting, diarrhea.  ED COURSE:  The patient was afebrile, hypertensive, with blood pressure of 165/83, normal respiratory rate, normal heart rate, saturating 100% on 2 liters nasal cannula.  She received albuterol 5 mg neb treatment en route the ED via EMS.  In the ED, she received albuterol 10 mg/Atrovent 1 mg neb treatment and prednisone 60 mg p.o. x1.  She had no wheezing on exam.  O2 sats were 97-98% on room air.  O2 sat dropped to 89% when the patient walks.  MEDICATIONS:  The patient's  medications include: 1. Albuterol. 2. ProAir inhaler, 2 puffs, inhaled every 4 hours as needed for     shortness of breath. 3. Aspirin 81 mg p.o. daily. 4. Pulmicort 90 mcg per ACT inhaler. 5. Calcium carbonate 1500 mg daily. 6. Coreg 12.5 mg 2 times daily. 7. Lisinopril/HCTZ 20/12.5 mg tablets, 2 tablets p.o. daily. 8. Multivitamin 1 capsule p.o. daily. 9. Omega-3 fatty acid 1200 mg caps 2 tablets daily. 10.Coenzyme-Q10 one tablet daily. 11.Pravastatin 40 mg tab, 1 tablet p.o. daily at bedtime. 12.Simvastatin 80 mg tab, 1 tablet p.o. daily for cholesterol.  ALLERGIES/ADVERSE REACTIONS: 1. AMLODIPINE BESYLATE causes lower extremity swelling. 2. SULFA drugs causes rash.  PAST MEDICAL HISTORY:  Significant for hypertension, hyperlipidemia, the patient being overweight, allergic rhinitis, asthma, iron-deficiency anemia.  PAST SURGICAL HISTORY:  Breast lumpectomy for benign left breast mass in 1970s.  FAMILY HISTORY:  Hypertension in her mother and cancer in her father who is deceased.  SOCIAL HISTORY:  The patient is married.  She lives with her husband. 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  She has a daughter who lives nearby, has accompanied her to the hospital.  Previous  former smoker, 2 packs per day for 25 years, quit in 1995.  Alcohol, 2 drinks per week.  No illicit drug use.  REVIEW OF SYSTEMS:  CONSTITUTIONAL:  Negative for fever, weight change, hot and cold flashes.  HEENT:  As per HPI.  CV:  As per HPI. RESPIRATORY:  As per HPI.  GI:  Negative.  GU:  Negative.  ENDO: Negative.  MUSCULOSKELETAL:  Negative.  PHYSICAL EXAM:  VITAL SIGNS:  Temperature 97.9, blood pressure 166/86, heart rate 66, respiratory rate 19, 93% on room air. GENERAL:  The patient is alert, cooperative, in no distress, appears stated age. HEENT:  Head: Normocephalic without obvious abnormality or trauma. Eyes:  Conjunctivae and cornea clear.  Extraocular muscles intact. Nose:  Nares  normal.  No drainage.  Throat/Lips:  Mucosa normal. NECK:  Supple, symmetrical.  Trachea midline.  No adenopathy. LUNGS:  Clear to auscultation bilaterally. RESPIRATIONS:  Unlabored. CHEST WALL:  No tenderness or deformity. HEART:  Regular rate and rhythm.  S1 and S2.  No murmur, rubs or gallops. ABDOMEN:  Soft, nontender.  Bowel sounds active in all four quadrants. No masses.  No organomegaly. EXTREMITIES:  Normal.  Atraumatic.  No cyanosis or edema.  Pulses 2+ symmetrical in all extremities. SKIN:  Skin color, texture, and turgor normal.  No rashes or lesions. NEURO:  Cranial nerves II through XII intact.  Normal strength and sensation throughout.  LABS:  The patient had a CBC, white blood cells 10.5, hemoglobin 11.8, hematocrit 36.2, MCV 99.2, platelets 211.  The patient had a BMET, sodium 133, potassium 5.6, chloride 99, CO2 25, BUN 20, creatinine 1.15, glucose 112, calcium 8.8.  BNP 48.3.  D-dimer 0.27.  Point-of-care cardiac enzymes within normal limits with a troponin of less than 0.05.  IMAGING:  Chest x-ray obtained on February 13, 2011, showed biapical pleural parenchymal scarring.  Lungs are otherwise clear.  EKG, normal sinus rhythm with a rate in the 60s.  ASSESSMENT AND PLAN:  A 65 year old female with history of mild persistent asthma with worsening symptoms during the fall or spring, who presents with shortness of breath and hypoxemia. 1. Shortness of breath, most likely secondary to asthma exacerbation     due to viral URI/allergies.  Also, consider cardiac cause unlikely     given the patient's lack of chest pain, normal EKG, normal chest x-     ray, normal BNP, and negative point-of-care cardiac enzymes.  We     will not cycle cardiac enzymes as the patient has no chest pain or     history of coronary artery disease.  Plan to continue continuous     pulse-ox to determine if the patient requires supplemental oxygen     overnight.  We will give albuterol 5 mg inhaler  with spacer first     dose now.  Although, the patient's lungs are clear to auscultation     10 hours post treatment.  We will continue prednisone for 5-day     course.  We will continue Pulmicort if the patient is here until     tomorrow.  We will start oral PPI for GI prophylaxis and continue     the steroids. 2. Hypertension.  The patient with known hypertension and elevated     systolic blood pressure, which is ranging from 140s-190s during the     past clinic visits.  Plan to continue home carvedilol and Prinzide. 3. FEN/GI.  The patient with slightly low sodium and slightly elevated  potassium.  Has had a slightly elevated potassium in the past,     ranging 5-5.9, unclear etiology, questionable drug induce from ACE.     Since the patient's EKG is normal and her potassium is within her     normal range.  We will continue ACE, repeat BMET, regular diet. 4. DVT prophylaxis, heparin 5000 units subcu t.i.d. 5. Disposition:  Anticipate short hospital course if the patient     remains clinically stable.  Plan to discharge to home.    ______________________________ Dessa Phi, MD   ______________________________ Nestor Ramp, MD    JF/MEDQ  D:  02/14/2011  T:  02/14/2011  Job:  [id number]  cc:   Angeline Slim, MD  Electronically Signed by Dessa Phi MD on 03/22/2011 02:56:16 PM Electronically Signed by Denny Levy MD on 03/27/2011 04:32:58 PM

## 2011-02-25 NOTE — Discharge Summary (Addendum)
Sabrina Powell, Sabrina Powell             ACCOUNT NO.:  [id number]  MEDICAL RECORD NO.:  [id number]           PATIENT TYPE:  E  LOCATION:  WLED                         FACILITY:  [facility]  PHYSICIAN:  Leighton Roach Ranulfo Kall, M.D.DATE OF BIRTH:  July 13, 1946  DATE OF ADMISSION:  02/13/2011 DATE OF DISCHARGE:  02/13/2011                              DISCHARGE SUMMARY   PRIMARY CARE PHYSICIAN:  Angeline Slim, MD, [facility].  DISCHARGE DIAGNOSES: 1. Asthma exacerbation, resolved. 2. Allergic rhinitis. 3. Hypertension. 4. Hyperlipidemia. 5. Iron-deficiency anemia.  DISCHARGE MEDICATIONS:  New medications at discharge prednisone 40 mg p.o. daily for 5 days to complete a 7-day course.  Home medications changed:  Albuterol 90 mcg aerosol inhaler 2 puffs inhaled q.4 h. to use regularly for the next 2 days and then as needed.  Home medications continued: 1. Calcium carbonate plus vitamin D OTC 1 tablet p.o. b.i.d. 2. Co-enzyme Q10 one tablet p.o. daily. 3. Fish oil 1200 mg 2 tablets p.o. daily. 4. Motrin 200 mg 2 tablets p.o. every 4 hours as needed for pain. 5. Lisinopril/hydrochlorothiazide 20/12.5 mg 2 tablets p.o. daily. 6. Multivitamin 1 tablet p.o. daily. 7. Opcon-A 1 tablet p.o. daily as needed for dry eyes. 8. Pravastatin 40 mg 1 tablet p.o. daily. 9. Pulmicort 2 puffs by mouth daily. 10.Ketotifen fumarate 0.025% one drop in both eyes daily as needed for     dry eyes.  Home medications stopped:  Carvedilol 12.5 mg 1 tablet p.o. twice daily.  PERTINENT DISCHARGE LABORATORY DATA:  The patient's cardiac enzymes are negative x2.  Potassium 4.3 and sodium 135.  PERTINENT STUDIES:  None.  BRIEF HOSPITAL COURSE:  The patient is 65 year old female who was admitted with a chief complaint of shortness of breath and increased work of breathing and was admitted for asthma exacerbation and with hypoxia. 1. Shortness of breath secondary to asthma exacerbation and sided by  what sounds like upper respiratory infection and allergic rhinitis.     The patient was put on continuous pulse ox to monitor O2 sats which     were initially about 93% at rest up to low 90s with ambulation.     Overnight, the patient did well.  She did not require albuterol     treatment.  She was having a little bit of wheezing where she was     evaluated in the morning and one treatment was given so the patient     was able to be spaced for more than 12 hours without a treatment to     determine that she was stable for discharge on the instructions to     continue albuterol as scheduled and then Pulmicort as well as     prednisone to complete a 7-day course, dose as mentioned above.     The patient's carvedilol was discontinued as her blood pressure was     well controlled and a little on the low side with a systolic range     of 102/61 and since beta blockers can  exacerbate asthma, it was     determined best to hold carvedilol for now.  The patient described     some chest tightness along with some shortness of breath that she     attributed to tachypnea and coughing.  However, cardiac enzymes     were cycled to rule out ACS and they were negative as mentioned     above. 2. Hypertension.  The patient was continued on her home dose of     lisinopril, hydrochlorothiazide, and carvedilol during her     admission.  Her blood pressures systolics ranged from 102-156,     diastolics 52-80, most recent at discharge 102/61.  At that time,     the patient's carvedilol was discontinued and it was believed that     the patient's blood pressures were still well controlled because of     poor compliance and that she likely did not require beta blocker. 3. Hyperkalemia.  The patient's potassium was elevated on admission.     Repeat potassium was low as mentioned above.  She has history of     hyperkalemia in the past and her potassium was within range of her     normal per clinic  notes.  DISCHARGE INSTRUCTIONS:  The patient was discharged to home with instructions with no limitations on activity or diet.  Instructed to follow up with Dr. Angeline Slim, her primary MD at [facility] and to call for an appointment within the next 1-2 weeks.  CONDITION ON DISCHARGE:  She is discharged in stable medical condition.  FOLLOWUP ISSUES AND RECOMMENDATIONS: 1. Asthma.  Follow up compliance with her Pulmicort and albuterol.     The patient has not had an asthma exacerbation in many years and may require     an adjustment in her allergy medicines to prevent     exacerbations during the spring. 2. Hypertension.  The patient's carvedilol is stopped at discharge     given her low blood pressure and the risk of dyspnea and asthma     exacerbation.  Consider starting the patient on calcium channel     blocker or amlodipine if blood pressures are elevated on followup.    ______________________________ Dessa Phi, MD   ______________________________ Leighton Roach Laurette Villescas, M.D.    JF/MEDQ  D:  02/17/2011  T:  02/18/2011  Job:  [id number]  Electronically Signed by Dessa Phi MD on 03/22/2011 02:57:21 PM Electronically Signed by Acquanetta Belling M.D. on 03/27/2011 05:32:45 PM

## 2011-12-18 ENCOUNTER — Other Ambulatory Visit: Payer: Self-pay | Admitting: Family Medicine

## 2011-12-18 NOTE — Telephone Encounter (Signed)
Refill request

## 2011-12-23 ENCOUNTER — Ambulatory Visit (INDEPENDENT_AMBULATORY_CARE_PROVIDER_SITE_OTHER): Payer: BC Managed Care – PPO | Admitting: Family Medicine

## 2011-12-23 ENCOUNTER — Encounter: Payer: Self-pay | Admitting: Family Medicine

## 2011-12-23 DIAGNOSIS — Z0000 Encounter for general adult medical examination without abnormal findings: Secondary | ICD-10-CM

## 2011-12-23 DIAGNOSIS — I10 Essential (primary) hypertension: Secondary | ICD-10-CM

## 2011-12-23 DIAGNOSIS — Z01419 Encounter for gynecological examination (general) (routine) without abnormal findings: Secondary | ICD-10-CM | POA: Insufficient documentation

## 2011-12-23 DIAGNOSIS — J45909 Unspecified asthma, uncomplicated: Secondary | ICD-10-CM

## 2011-12-23 DIAGNOSIS — E663 Overweight: Secondary | ICD-10-CM

## 2011-12-23 DIAGNOSIS — E785 Hyperlipidemia, unspecified: Secondary | ICD-10-CM

## 2011-12-23 DIAGNOSIS — J309 Allergic rhinitis, unspecified: Secondary | ICD-10-CM

## 2011-12-23 MED ORDER — LISINOPRIL-HYDROCHLOROTHIAZIDE 20-12.5 MG PO TABS: 2.0000 | ORAL_TABLET | Freq: Every day | ORAL | Status: DC

## 2011-12-23 MED ORDER — BUDESONIDE 90 MCG/ACT IN AEPB: 1.0000 | INHALATION_SPRAY | Freq: Two times a day (BID) | RESPIRATORY_TRACT | Status: DC

## 2011-12-23 MED ORDER — FLUTICASONE PROPIONATE 50 MCG/ACT NA SUSP: 1.0000 | Freq: Every day | NASAL | Status: DC

## 2011-12-23 MED ORDER — ZOSTER VACCINE LIVE 19400 UNT/0.65ML ~~LOC~~ SOLR: 0.6500 mL | Freq: Once | SUBCUTANEOUS | Status: DC

## 2011-12-23 MED ORDER — CARVEDILOL 12.5 MG PO TABS: 12.5000 mg | ORAL_TABLET | Freq: Two times a day (BID) | ORAL | Status: DC

## 2011-12-23 MED ORDER — ALBUTEROL SULFATE HFA 108 (90 BASE) MCG/ACT IN AERS: INHALATION_SPRAY | RESPIRATORY_TRACT | Status: DC

## 2011-12-23 MED ORDER — PRAVASTATIN SODIUM 40 MG PO TABS: 40.0000 mg | ORAL_TABLET | Freq: Every day | ORAL | Status: DC

## 2011-12-23 NOTE — Assessment & Plan Note (Addendum)
BP usually elevated when in office, but per pt is normal at home.  Pt is to check BP 2-3 times per week and record these.  If majority are <135/90, no need for medication adjustment.  Could consider ambulatory BP monitoring in this pt.  Check BMET with FLP.  Continue ASA.  No changed to coreg and lisinopril/HCTZ at this time until outside readings are done.

## 2011-12-23 NOTE — Progress Notes (Signed)
Subjective:     Sabrina Powell is a 66 y.o. female and is here for a comprehensive physical exam. The patient reports no problems.  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  Also has a brother who is terminally ill in [place].  Husband retired in Dec w/o retirement plan.  Drinks 1 glass of wine per night, no tobacco or drugs.  Feels like she is dealing with her stress "ok."  Denies any offered resources.  HTN: states that BPs at home/when checked at the pharmacy are usually 120-130/70-80, most recently 128/78.  Denies CP, LE edema, SOB.  Asthma: Well controlled, uses albuterol 2-3 times per week, only using Pulmicort once per day.   History   Social History  . Marital Status: Married    Spouse Name: N/A    Number of Children: N/A  . Years of Education: N/A   Occupational History  . 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 Smoking status: Former Smoker -- 2.0 packs/day for 25 years    Types: Cigarettes    Quit date: 02/13/1994  . Smokeless tobacco: Never Used  . Alcohol Use: 1.0 oz/week    2 drink(s) per week  . Drug Use: No  . Sexually Active: Not on file   Other Topics Concern  . Not on file   Social History Narrative  . No narrative on file   Health Maintenance  Topic Date Due  . Pap Smear  10/24/1966  . Zostavax  10/24/2008  . Influenza Vaccine  08/11/2012  . Mammogram  10/26/2012  . Tetanus/tdap  01/02/2019  . Colonoscopy  12/22/2021    The following portions of the patient's history were reviewed and updated as appropriate: allergies, current medications, past family history, past medical history, past social history, past surgical history and problem list.  Review of Systems Constitutional: negative for anorexia, chills, fatigue, fevers, malaise and night sweats Eyes: negative for visual disturbance Ears, nose, mouth, throat, and face: negative for hoarseness, nasal  congestion, sore throat and tinnitus Respiratory: negative for cough, dyspnea on exertion and sputum Cardiovascular: negative for chest pain, chest pressure/discomfort, claudication, dyspnea, exertional chest pressure/discomfort, lower extremity edema, near-syncope and palpitations Gastrointestinal: negative for abdominal pain, constipation, diarrhea, dyspepsia, melena, nausea and vomiting Genitourinary:negative for dysuria Musculoskeletal:negative for arthralgias, back pain and muscle weakness Neurological: negative for dizziness, gait problems, headaches and memory problems Behavioral/Psych: positive for anxiety, negative for decreased appetite, depression, excessive alcohol consumption, illegal drug usage, irritability, loss of interest in favorite activities, sleep disturbance and tobacco use Endocrine: negative for diabetic symptoms including blurry vision, polydipsia and polyuria and temperature intolerance   Objective:    BP 160/82  Pulse 73  Ht 5' (1.524 m)  Wt 126 lb (57.153 kg)  BMI 24.61 kg/m2 General appearance: alert, cooperative, appears stated age and no distress Head: Normocephalic, without obvious abnormality, atraumatic Eyes: conjunctivae/corneas clear. PERRL, EOM's intact. Fundi benign. Ears: normal TM's and external ear canals both ears Nose: Nares normal. Septum midline. Mucosa normal. No drainage or sinus tenderness. Throat: lips, mucosa, and tongue normal; teeth and gums normal Neck: no adenopathy, supple, symmetrical, trachea midline and thyroid not enlarged, symmetric, no tenderness/mass/nodules Lungs: clear to auscultation bilaterally Heart: regular rate and rhythm, S1, S2 normal, no murmur, click, rub or gallop Abdomen: soft, non-tender; bowel sounds normal; no masses,  no organomegaly Extremities: extremities normal, atraumatic,  no cyanosis or edema Pulses: 2+ and symmetric Skin: Skin color, texture, turgor normal. No rashes or lesions Lymph nodes: Cervical,  supraclavicular, and axillary nodes normal. Neurologic: Grossly normal    Assessment:    Healthy female exam. HTN- repeat BP improved to 138/85.  Asthma- increase controlled to BID     Plan:     See After Visit Summary for Counseling Recommendations

## 2011-12-23 NOTE — Assessment & Plan Note (Signed)
Only taking controlled med once per day.  Advised to increase to BID to help decrease albuterol need. NO wheezes or increased WOB on exam today.

## 2011-12-23 NOTE — Patient Instructions (Addendum)
It was great to meet you today! I am giving you a prescription for the Shingles Vaccine.  I understand that you don't want to have a colonoscopy done.  It is still something that we recommend to help find colon cancer early.  Please consider having this done at some point in the furture.  Your blood pressure is a little high.  I am going to talk with our Pharmacist about having you check your blood pressure while at home.  In the mean time, please try to check your blood pressure TWICE A WEEK and write the numbers down. Increase your controlled inhaler to TWICE per day. Please come back whenever you have a chance while FASTING to have some labs drawn.  Come back and see me in 1-2 months so we can see how your blood pressure is.

## 2011-12-23 NOTE — Assessment & Plan Note (Signed)
Zostavax Rx printed and handed to pt-- she will call and notify us when she has gotten it.  Refuses colonoscopy at this time, counseled pt on the important of this.  Mammogram up to date.  Pt no longer needs Paps- never had an abnormal, previous PCP had told her she was finished, >66 yo, likely ok to stop.

## 2011-12-23 NOTE — Assessment & Plan Note (Signed)
Pt taking fish oil, but only occasionally taking pravastatin.  Advised pt to restart this.  WIll have pt return for FLP and will likely need to re-advise her to restart statin.

## 2011-12-23 NOTE — Assessment & Plan Note (Signed)
BMI now 24.  Encouraged diet and exercise.  Pt uses walking as a stress relief during the warmer months.  Encouraged her to continue this.   Wt Readings from Last 3 Encounters:  12/23/11 126 lb (57.153 kg)  02/19/11 132 lb (59.875 kg)  11/26/10 128 lb (58.06 kg)

## 2013-01-01 ENCOUNTER — Ambulatory Visit (INDEPENDENT_AMBULATORY_CARE_PROVIDER_SITE_OTHER): Payer: BC Managed Care – PPO | Admitting: Family Medicine

## 2013-01-01 ENCOUNTER — Encounter: Payer: Self-pay | Admitting: Family Medicine

## 2013-01-01 VITALS — BP 138/66 | HR 74 | Ht 60.0 in | Wt 123.0 lb

## 2013-01-01 DIAGNOSIS — Z01419 Encounter for gynecological examination (general) (routine) without abnormal findings: Secondary | ICD-10-CM

## 2013-01-01 DIAGNOSIS — J45909 Unspecified asthma, uncomplicated: Secondary | ICD-10-CM

## 2013-01-01 DIAGNOSIS — E785 Hyperlipidemia, unspecified: Secondary | ICD-10-CM

## 2013-01-01 DIAGNOSIS — Z0000 Encounter for general adult medical examination without abnormal findings: Secondary | ICD-10-CM

## 2013-01-01 DIAGNOSIS — I10 Essential (primary) hypertension: Secondary | ICD-10-CM

## 2013-01-01 DIAGNOSIS — D509 Iron deficiency anemia, unspecified: Secondary | ICD-10-CM

## 2013-01-01 MED ORDER — LISINOPRIL-HYDROCHLOROTHIAZIDE 20-12.5 MG PO TABS: 2.0000 | ORAL_TABLET | Freq: Every day | ORAL | Status: DC

## 2013-01-01 MED ORDER — ZOSTER VACCINE LIVE 19400 UNT/0.65ML ~~LOC~~ SOLR: 0.6500 mL | Freq: Once | SUBCUTANEOUS | Status: AC

## 2013-01-01 MED ORDER — CARVEDILOL 12.5 MG PO TABS: 12.5000 mg | ORAL_TABLET | Freq: Two times a day (BID) | ORAL | Status: DC

## 2013-01-01 MED ORDER — ALBUTEROL SULFATE HFA 108 (90 BASE) MCG/ACT IN AERS: INHALATION_SPRAY | RESPIRATORY_TRACT | Status: DC

## 2013-01-01 MED ORDER — FLUTICASONE PROPIONATE HFA 110 MCG/ACT IN AERO: 1.0000 | INHALATION_SPRAY | Freq: Two times a day (BID) | RESPIRATORY_TRACT | Status: DC

## 2013-01-01 NOTE — Assessment & Plan Note (Addendum)
BP well controlled outside of office per pt.  Not terribly elevated today.  Will continue coreg 12.5 and lisino/HCTZ 20/12.5.  Check CMET. Consider ambulatory BP monitoring.

## 2013-01-01 NOTE — Assessment & Plan Note (Addendum)
Not taking fish oil or statin.  Will return for FLP. Aware she may need to restart statin. Stopped Co-Q 10 when she stopped statin.

## 2013-01-01 NOTE — Assessment & Plan Note (Signed)
Doing well, would like Zostavax reprinted.  Plans to get mammogram in next few weeks.  No pap needed based on age and no history of abnormal.

## 2013-01-01 NOTE — Assessment & Plan Note (Signed)
Check CBC 

## 2013-01-01 NOTE — Assessment & Plan Note (Signed)
Well controlled on Pulmicort-- no longer covered by insurance, will try Flovent.  If not well controlled, will change back to Pulmicort and pt will pay higher copay.

## 2013-01-01 NOTE — Progress Notes (Signed)
  Subjective:     Sabrina Powell is a 67 y.o. female and is here for a comprehensive physical exam. The patient reports problems - :.  HTN: coreg 12.5 BID, lisino/HCTZ 20/12.5; well controlled outside of office- was 118/68 1.5 weeks ago; is usually 120's/70's-80's at [facility] when she checks it. No chest pain or SOB, no LE edema, no dizziness  HLD: stopped taking statin and fish oil, no particular reason, just didn't want to take them   History   Social History  . Marital Status: Married    Spouse Name: N/A    Number of Children: N/A  . Years of Education: N/A   Occupational History  . 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 Smoking status: Former Smoker -- 2.00 packs/day for 25 years    Types: Cigarettes    Quit date: 02/13/1994  . Smokeless tobacco: Never Used  . Alcohol Use: 1.0 oz/week    2 drink(s) per week  . Drug Use: No  . Sexually Active: Not on file   Other Topics Concern  . Not on file   Social History Narrative  . No narrative on file   Health Maintenance  Topic Date Due  . Zostavax  10/24/2008  . Mammogram  10/26/2012  . Influenza Vaccine  07/12/2013  . Tetanus/tdap  01/02/2019  . Colonoscopy  12/22/2021    The following portions of the patient's history were reviewed and updated as appropriate: allergies, current medications, past family history, past medical history, past social history, past surgical history and problem list.  Review of Systems Constitutional: negative for fevers and weight loss Ears, nose, mouth, throat, and face: negative for hearing loss, sore throat, tinnitus and voice change Respiratory: negative for cough, dyspnea on exertion and wheezing Cardiovascular: negative for chest pain, chest pressure/discomfort, dyspnea, exertional chest pressure/discomfort, irregular heart beat, lower extremity edema, near-syncope and syncope Gastrointestinal: negative for change in bowel habits, constipation, diarrhea, nausea  and vomiting Genitourinary:negative for dysuria and urinary incontinence Musculoskeletal:negative for muscle weakness and neck pain Neurological: negative for dizziness, headaches, memory problems and weakness Behavioral/Psych: positive for stress, negative for anxiety, depression, fatigue, irritability and tobacco use Endocrine: negative for diabetic symptoms including polydipsia and polyuria and temperature intolerance   Objective:    BP 188/98  Pulse 74  Ht 5' (1.524 m)  Wt 123 lb (55.792 kg)  BMI 24.02 kg/m2 General appearance: alert, cooperative and appears stated age Eyes: conjunctivae/corneas clear. PERRL, EOM's intact. Fundi benign. Throat: lips, mucosa, and tongue normal; teeth and gums normal Neck: no adenopathy, supple, symmetrical, trachea midline and thyroid not enlarged, symmetric, no tenderness/mass/nodules Lungs: clear to auscultation bilaterally Heart: regular rate and rhythm, S1, S2 normal, no murmur, click, rub or gallop Abdomen: soft, non-tender; bowel sounds normal; no masses,  no organomegaly Extremities: extremities normal, atraumatic, no cyanosis or edema Pulses: 2+ and symmetric Neurologic: Grossly normal    Assessment:    Healthy female exam.      Plan:     See After Visit Summary for Counseling Recommendations

## 2013-01-01 NOTE — Patient Instructions (Addendum)
  It was good to see you today.  Your blood pressure was better on recheck-- no need to change medicines.  I will refill all of your medicines for a year.  Make sure you get your mammogram!  I printed the prescription for the Shingles vaccine.  Come back for fasting labs in the next few weeks and your next well woman exam in about a year!

## 2013-02-08 ENCOUNTER — Other Ambulatory Visit: Payer: Self-pay | Admitting: Family Medicine

## 2013-02-08 DIAGNOSIS — J309 Allergic rhinitis, unspecified: Secondary | ICD-10-CM

## 2013-02-08 MED ORDER — FLUTICASONE PROPIONATE 50 MCG/ACT NA SUSP: 1.0000 | Freq: Every day | NASAL | Status: AC

## 2013-02-08 MED ORDER — BUDESONIDE 90 MCG/ACT IN AEPB: 1.0000 | INHALATION_SPRAY | Freq: Two times a day (BID) | RESPIRATORY_TRACT | Status: DC

## 2014-01-18 ENCOUNTER — Ambulatory Visit (INDEPENDENT_AMBULATORY_CARE_PROVIDER_SITE_OTHER): Payer: BC Managed Care – PPO | Admitting: Family Medicine

## 2014-01-18 ENCOUNTER — Encounter: Payer: Self-pay | Admitting: Family Medicine

## 2014-01-18 VITALS — BP 163/81 | HR 81 | Temp 98.2°F | Wt 126.0 lb

## 2014-01-18 DIAGNOSIS — I10 Essential (primary) hypertension: Secondary | ICD-10-CM

## 2014-01-18 DIAGNOSIS — F4323 Adjustment disorder with mixed anxiety and depressed mood: Secondary | ICD-10-CM

## 2014-01-18 DIAGNOSIS — J45909 Unspecified asthma, uncomplicated: Secondary | ICD-10-CM

## 2014-01-18 MED ORDER — FLUTICASONE PROPIONATE HFA 110 MCG/ACT IN AERO: 1.0000 | INHALATION_SPRAY | Freq: Two times a day (BID) | RESPIRATORY_TRACT | Status: AC

## 2014-01-18 MED ORDER — BUDESONIDE 90 MCG/ACT IN AEPB: 1.0000 | INHALATION_SPRAY | Freq: Two times a day (BID) | RESPIRATORY_TRACT | Status: DC

## 2014-01-18 MED ORDER — LISINOPRIL-HYDROCHLOROTHIAZIDE 20-12.5 MG PO TABS: 2.0000 | ORAL_TABLET | Freq: Every day | ORAL | Status: DC

## 2014-01-18 MED ORDER — CARVEDILOL 12.5 MG PO TABS: 12.5000 mg | ORAL_TABLET | Freq: Two times a day (BID) | ORAL | Status: DC

## 2014-01-18 MED ORDER — ALBUTEROL SULFATE HFA 108 (90 BASE) MCG/ACT IN AERS: INHALATION_SPRAY | RESPIRATORY_TRACT | Status: DC

## 2014-01-18 MED ORDER — ASPIRIN EC 81 MG PO TBEC
81.0000 mg | DELAYED_RELEASE_TABLET | Freq: Every day | ORAL | Status: AC
Start: 2014-01-18 — End: 2015-01-18

## 2014-01-18 NOTE — Patient Instructions (Signed)
Thank you for coming in today.  I am sorry that you are going through such a difficult time. If there is anything I can do to help or you just want to talk again, please feel free to come back. If you start to feel like you might hurt yourself or someone else, please call 911 or go to the emergency room.  Take care.

## 2014-01-19 DIAGNOSIS — F322 Major depressive disorder, single episode, severe without psychotic features: Secondary | ICD-10-CM | POA: Insufficient documentation

## 2014-01-19 NOTE — Assessment & Plan Note (Addendum)
Refilled meds today, will return for htn f/u when home situation resolved in a few months

## 2014-01-19 NOTE — Assessment & Plan Note (Signed)
Normal reaction to difficult situation - discussed options for symptom management including counseling, hypnotics, antidepressant  - pt elects to return to see me for further counseling prn

## 2014-01-19 NOTE — Assessment & Plan Note (Signed)
Well controlled, refilled meds today

## 2014-01-19 NOTE — Progress Notes (Signed)
   Subjective:    Patient ID: Sabrina Powell, female    DOB: 07-20-1946, 68 y.o.   MRN: [id number]  Hypertension   Pt presents for severely depressed mood for the past 2 months. She reports everything was going well until she was watching TV and saw her grandson being arrested for a felony on the news. Since then she has been extremely anxious and depressed. She has not been sleeping because she just lies in bed thinking about him. She feels very ashamed and does not want friends or coworkers to know so she doesn't have anyone to talk to about it. She also feels guilty because she played a key role in raising him. She has not had any appetite and when she does eat she does not have the energy to prepare healthy foods. She has no personal or family history of depression. She also endorses increased alcohol consumption as a coping mechanism that she has been employing.   Review of Systems  Constitutional: Positive for activity change and appetite change.  Psychiatric/Behavioral: Positive for sleep disturbance, dysphoric mood and decreased concentration. Negative for suicidal ideas. The patient is nervous/anxious.   All other systems reviewed and are negative.       Objective:   Physical Exam  Nursing note and vitals reviewed. Constitutional: She is oriented to person, place, and time. She appears well-developed and well-nourished. No distress.  HENT:  Head: Normocephalic and atraumatic.  Eyes: Conjunctivae are normal. Right eye exhibits no discharge. Left eye exhibits no discharge.  Cardiovascular: Normal rate.   Pulmonary/Chest: Effort normal.  Neurological: She is alert and oriented to person, place, and time.  Skin: She is not diaphoretic.  Psychiatric: Her speech is normal and behavior is normal. Judgment and thought content normal. Her mood appears anxious. Her affect is not angry, not labile and not inappropriate. Cognition and memory are normal. She exhibits a depressed mood.           Assessment & Plan:

## 2015-03-13 ENCOUNTER — Ambulatory Visit (INDEPENDENT_AMBULATORY_CARE_PROVIDER_SITE_OTHER): Payer: BLUE CROSS/BLUE SHIELD | Admitting: Family Medicine

## 2015-03-13 ENCOUNTER — Encounter: Payer: Self-pay | Admitting: Family Medicine

## 2015-03-13 VITALS — BP 172/85 | HR 80 | Temp 98.4°F | Ht 60.0 in | Wt 121.2 lb

## 2015-03-13 DIAGNOSIS — Z0000 Encounter for general adult medical examination without abnormal findings: Secondary | ICD-10-CM | POA: Diagnosis not present

## 2015-03-13 DIAGNOSIS — Z01419 Encounter for gynecological examination (general) (routine) without abnormal findings: Secondary | ICD-10-CM

## 2015-03-13 DIAGNOSIS — J4530 Mild persistent asthma, uncomplicated: Secondary | ICD-10-CM

## 2015-03-13 DIAGNOSIS — E785 Hyperlipidemia, unspecified: Secondary | ICD-10-CM | POA: Diagnosis not present

## 2015-03-13 DIAGNOSIS — I10 Essential (primary) hypertension: Secondary | ICD-10-CM | POA: Diagnosis not present

## 2015-03-13 MED ORDER — ALBUTEROL SULFATE HFA 108 (90 BASE) MCG/ACT IN AERS: INHALATION_SPRAY | RESPIRATORY_TRACT | Status: AC

## 2015-03-13 MED ORDER — CARVEDILOL 12.5 MG PO TABS: 12.5000 mg | ORAL_TABLET | Freq: Two times a day (BID) | ORAL | Status: DC

## 2015-03-13 MED ORDER — LISINOPRIL-HYDROCHLOROTHIAZIDE 20-12.5 MG PO TABS: 2.0000 | ORAL_TABLET | Freq: Every day | ORAL | Status: DC

## 2015-03-13 MED ORDER — BUDESONIDE 90 MCG/ACT IN AEPB: 1.0000 | INHALATION_SPRAY | Freq: Two times a day (BID) | RESPIRATORY_TRACT | Status: AC

## 2015-03-13 NOTE — Patient Instructions (Signed)

## 2015-03-22 NOTE — Assessment & Plan Note (Signed)
Uncontrolled today but off meds, 172/85 - refill coreg, lisinopril-hctz - f/u in 1 month on meds to assess control

## 2015-03-22 NOTE — Progress Notes (Signed)
   Subjective:    Patient ID: Sabrina Powell, female    DOB: 1946/10/01, 69 y.o.   MRN: [id number]  HPI Pt presents for f/u of hypertension and annual exam. Poor f/u and compliance over the past year. Has been off BP meds for a few weeks because she ran out and needs refills. Otherwise reports she is doing well.   Reports she has had DEXA and PNA shot already, needs mammogram and planning to schedule soon.   Review of Systems  Constitutional: Negative for fever.  Respiratory: Negative for shortness of breath.   Cardiovascular: Negative for chest pain.  Gastrointestinal: Negative for nausea, vomiting, diarrhea and constipation.  Endocrine: Negative for polydipsia and polyuria.  Genitourinary: Negative for dysuria.  Neurological: Negative for dizziness and weakness.  All other systems reviewed and are negative.      Objective:   Physical Exam  Constitutional: She is oriented to person, place, and time. She appears well-developed and well-nourished. No distress.  HENT:  Head: Normocephalic and atraumatic.  Eyes: Conjunctivae and EOM are normal. Pupils are equal, round, and reactive to light. Right eye exhibits no discharge. Left eye exhibits no discharge.  Neck: Normal range of motion. Neck supple. No thyromegaly present.  Cardiovascular: Normal rate, regular rhythm and normal heart sounds.   No murmur heard. Pulmonary/Chest: Effort normal and breath sounds normal. No respiratory distress. She has no wheezes.  Abdominal: Soft. Bowel sounds are normal. She exhibits no distension. There is no tenderness.  Lymphadenopathy:    She has no cervical adenopathy.  Neurological: She is alert and oriented to person, place, and time.  Skin: Skin is warm and dry. No rash noted. She is not diaphoretic.  Psychiatric: She has a normal mood and affect. Her behavior is normal.  Nursing note and vitals reviewed.         Assessment & Plan:

## 2015-03-22 NOTE — Assessment & Plan Note (Signed)
Not currently taking statin, will return for FLP and will discuss restarting statin pending those results

## 2015-06-13 ENCOUNTER — Telehealth: Payer: Self-pay | Admitting: Family Medicine

## 2015-06-13 NOTE — Telephone Encounter (Signed)
Pt daughter, Rulon Eisenmenger, calling and very concerned about pt's alcohol abuse. Would like to speak to someone about options that could be done to help her. Velna Hatchet may be reached at phone number [phone]. Sending to PCP's team and CSW. Thank you, Dorothey Baseman, ASA

## 2015-06-13 NOTE — Telephone Encounter (Signed)
CSW spoke with pts daughter and provided active listening as daughter expressed that she is concerned with pts alcohol abuse. CSW did not provide any pt information rather just provided information on resources. CSW informed daughter that resources are available for alcohol abuse (inpatient, intensive outpatient and outpatient treatment) however the individual needs to be agreeable to treatment. Daughter states that she and pts husband are concerned because pt fainted yesterday and appears, lethargic and maybe even dehydrated. CSW informed daughter that if pt does not appear to be doing well that it would be recommended for her to be taken to the ED to be evaluated. Daughter unsure as to whether they will take pt to the ED or see if they can bring pt tomorrow.   CSW agreeable to provide additional resources and support to pt as agreeable.  Theresia Bough, MSW, LCSW [phone]

## 2015-06-14 ENCOUNTER — Ambulatory Visit (INDEPENDENT_AMBULATORY_CARE_PROVIDER_SITE_OTHER): Payer: Medicare Other | Admitting: Family Medicine

## 2015-06-14 ENCOUNTER — Encounter: Payer: Self-pay | Admitting: Family Medicine

## 2015-06-14 VITALS — BP 129/62 | HR 101 | Temp 98.3°F | Ht 60.0 in | Wt 112.1 lb

## 2015-06-14 DIAGNOSIS — F322 Major depressive disorder, single episode, severe without psychotic features: Secondary | ICD-10-CM | POA: Diagnosis present

## 2015-06-14 DIAGNOSIS — F1010 Alcohol abuse, uncomplicated: Secondary | ICD-10-CM | POA: Diagnosis not present

## 2015-06-14 MED ORDER — SERTRALINE HCL 50 MG PO TABS: 50.0000 mg | ORAL_TABLET | Freq: Every day | ORAL | Status: DC

## 2015-06-14 NOTE — Assessment & Plan Note (Signed)
Considered Major Depressive Disorder now, previously with adjustment disorder from initial life stressor 12/2013, now repeated stressors and patient with multiple clinical symptoms depression worsening x 3 months, additionally comorbid with alcohol abuse as coping mechanism. - PHQ-9: score 18, very difficult. No longer has job (quit vs retired) - Denies suicidal or homicidal ideation - Has good support system  Plan: 1. Extensive counseling provided today on depression, symptoms, concerns, therapy, support system, when to seek immediate help, and future follow-up plans 2. Start Sertraline  daily x 1 month, continue for 6-12 mo trial, counseled on warning for suicidal risk 3. Counseled on significantly reducing alcohol consumption in order to treat MDD. Given substance resources provided by Theresia Bough CSW 4. Referral Psychiatry / counseling 5. RTC 1-2 weeks follow-up with PCP

## 2015-06-14 NOTE — Patient Instructions (Signed)
Dear Sabrina Powell, Thank you for coming in to clinic today. It was good to meet you.  1. Thank you for sharing all of your story. I believe that you are experiencing a Major Depressive Episode, and are struggling with Alcohol Abuse. 2. We will start with an anti-depressant medicine, Sertraline 50mg  daily (same time every day) for next 1 month, and most likely for up to 6 months. You may get some stomach upset / GI symptoms initially, these will get better. 3. For your alcohol use, this will worsen your depression. Important to start reducing alcohol consumption by half or 3/4 to start, then gradual taper off over next few weeks. Caution with signs of alcohol withdrawal (tremors, shaking, sweats, rarely progress to hallucinations, seizures). 4. Please review resources from social worker - Theresia Bough 5. Continue to check BP at home, if persistently low, you may return sooner, otherwise try to improve diet and stay well hydrated.  BP 129/62 mmHg  Pulse 101  Temp(Src) 98.3 F (36.8 C) (Oral)  Ht 5' (1.524 m)  Wt 112 lb 1.6 oz (50.848 kg)  BMI 21.89 kg/m2  Please schedule a follow-up appointment with Dr. Richarda Blade in 1 to 2 weeks for follow-up Depression / Alcohol abuse  If you have any other questions or concerns, please feel free to call the clinic to contact me. You may also schedule an earlier appointment if necessary.  However, if your symptoms get significantly worse, please go to the Emergency Department to seek immediate medical attention.  Saralyn Pilar, DO [facility]   Major Depressive Disorder Major depressive disorder is a mental illness. It also may be called clinical depression or unipolar depression. Major depressive disorder usually causes feelings of sadness, hopelessness, or helplessness. Some people with this disorder do not feel particularly sad but lose interest in doing things they used to enjoy (anhedonia). Major depressive disorder also can  cause physical symptoms. It can interfere with work, school, relationships, and other normal everyday activities. The disorder varies in severity but is longer lasting and more serious than the sadness we all feel from time to time in our lives. Major depressive disorder often is triggered by stressful life events or major life changes. Examples of these triggers include divorce, loss of your job or home, a move, and the death of a family member or close friend. Sometimes this disorder occurs for no obvious reason at all. People who have family members with major depressive disorder or bipolar disorder are at higher risk for developing this disorder, with or without life stressors. Major depressive disorder can occur at any age. It may occur just once in your life (single episode major depressive disorder). It may occur multiple times (recurrent major depressive disorder). SYMPTOMS People with major depressive disorder have either anhedonia or depressed mood on nearly a daily basis for at least 2 weeks or longer. Symptoms of depressed mood include:  Feelings of sadness (blue or down in the dumps) or emptiness.  Feelings of hopelessness or helplessness.  Tearfulness or episodes of crying (may be observed by others).  Irritability (children and adolescents). In addition to depressed mood or anhedonia or both, people with this disorder have at least four of the following symptoms:  Difficulty sleeping or sleeping too much.   Significant change (increase or decrease) in appetite or weight.   Lack of energy or motivation.  Feelings of guilt and worthlessness.   Difficulty concentrating, remembering, or making decisions.  Unusually slow movement (psychomotor retardation) or  restlessness (as observed by others).   Recurrent wishes for death, recurrent thoughts of self-harm (suicide), or a suicide attempt. People with major depressive disorder commonly have persistent negative thoughts about  themselves, other people, and the world. People with severe major depressive disorder may experiencedistorted beliefs or perceptions about the world (psychotic delusions). They also may see or hear things that are not real (psychotic hallucinations). DIAGNOSIS Major depressive disorder is diagnosed through an assessment by your health care provider. Your health care provider will ask aboutaspects of your daily life, such as mood,sleep, and appetite, to see if you have the diagnostic symptoms of major depressive disorder. Your health care provider may ask about your medical history and use of alcohol or drugs, including prescription medicines. Your health care provider also may do a physical exam and blood work. This is because certain medical conditions and the use of certain substances can cause major depressive disorder-like symptoms (secondary depression). Your health care provider also may refer you to a mental health specialist for further evaluation and treatment. TREATMENT It is important to recognize the symptoms of major depressive disorder and seek treatment. The following treatments can be prescribed for this disorder:   Medicine. Antidepressant medicines usually are prescribed. Antidepressant medicines are thought to correct chemical imbalances in the brain that are commonly associated with major depressive disorder. Other types of medicine may be added if the symptoms do not respond to antidepressant medicines alone or if psychotic delusions or hallucinations occur.  Talk therapy. Talk therapy can be helpful in treating major depressive disorder by providing support, education, and guidance. Certain types of talk therapy also can help with negative thinking (cognitive behavioral therapy) and with relationship issues that trigger this disorder (interpersonal therapy). A mental health specialist can help determine which treatment is best for you. Most people with major depressive disorder do  well with a combination of medicine and talk therapy. Treatments involving electrical stimulation of the brain can be used in situations with extremely severe symptoms or when medicine and talk therapy do not work over time. These treatments include electroconvulsive therapy, transcranial magnetic stimulation, and vagal nerve stimulation. Document Released: 02/22/2013 Document Revised: 03/14/2014 Document Reviewed: 02/22/2013 [facility] Patient Information 2015 [place], [place]. This information is not intended to replace advice given to you by your health care provider. Make sure you discuss any questions you have with your health care provider.   Alcohol Withdrawal Anytime drug use is interfering with normal living activities it has become abuse. This includes problems with family and friends. Psychological dependence has developed when your mind tells you that the drug is needed. This is usually followed by physical dependence when a continuing increase of drugs are required to get the same feeling or "high." This is known as addiction or chemical dependency. A person's risk is much higher if there is a history of chemical dependency in the family. Mild Withdrawal Following Stopping Alcohol, When Addiction or Chemical Dependency Has Developed When a person has developed tolerance to alcohol, any sudden stopping of alcohol can cause uncomfortable physical symptoms. Most of the time these are mild and consist of tremors in the hands and increases in heart rate, breathing, and temperature. Sometimes these symptoms are associated with anxiety, panic attacks, and bad dreams. There may also be stomach upset. Normal sleep patterns are often interrupted with periods of inability to sleep (insomnia). This may last for 6 months. Because of this discomfort, many people choose to continue drinking to get rid of this discomfort  and to try to feel normal. Severe Withdrawal with Decreased or No Alcohol Intake, When  Addiction or Chemical Dependency Has Developed About five percent of alcoholics will develop signs of severe withdrawal when they stop using alcohol. One sign of this is development of generalized seizures (convulsions). Other signs of this are severe agitation and confusion. This may be associated with believing in things which are not real or seeing things which are not really there (delusions and hallucinations). Vitamin deficiencies are usually present if alcohol intake has been long-term. Treatment for this most often requires hospitalization and close observation. Addiction can only be helped by stopping use of all chemicals. This is Powell but may save your life. With continual alcohol use, possible outcomes are usually loss of self respect and esteem, violence, and death. Addiction cannot be cured but it can be stopped. This often requires outside help and the care of professionals. Treatment centers are listed in the yellow pages under Cocaine, Narcotics, and Alcoholics Anonymous. Most hospitals and clinics can refer you to a specialized care center. It is not necessary for you to go through the uncomfortable symptoms of withdrawal. Your caregiver can provide you with medicines that will help you through this difficult period. Try to avoid situations, friends, or drugs that made it possible for you to keep using alcohol in the past. Learn how to say no. It takes a long period of time to overcome addictions to all drugs, including alcohol. There may be many times when you feel as though you want a drink. After getting rid of the physical addiction and withdrawal, you will have a lessening of the craving which tells you that you need alcohol to feel normal. Call your caregiver if more support is needed. Learn who to talk to in your family and among your friends so that during these periods you can receive outside help. Alcoholics Anonymous (AA) has helped many people over the years. To get further help,  contact AA or call your caregiver, counselor, or clergyperson. Al-Anon and Alateen are support groups for friends and family members of an alcoholic. The people who love and care for an alcoholic often need help, too. For information about these organizations, check your phone directory or call a local alcoholism treatment center.  SEEK IMMEDIATE MEDICAL CARE IF:   You have a seizure.  You have a fever.  You experience uncontrolled vomiting or you vomit up blood. This may be bright red or look like black coffee grounds.  You have blood in the stool. This may be bright red or appear as a black, tarry, bad-smelling stool.  You become lightheaded or faint. Do not drive if you feel this way. Have someone else drive you or call 454 for help.  You become more agitated or confused.  You develop uncontrolled anxiety.  You begin to see things that are not really there (hallucinate). Your caregiver has determined that you completely understand your medical condition, and that your mental state is back to normal. You understand that you have been treated for alcohol withdrawal, have agreed not to drink any alcohol for a minimum of 1 day, will not operate a car or other machinery for 24 hours, and have had an opportunity to ask any questions about your condition. Document Released: 08/07/2005 Document Revised: 01/20/2012 Document Reviewed: 06/15/2008 [facility] Patient Information 2015 [place], [place]. This information is not intended to replace advice given to you by your health care provider. Make sure you discuss any questions you have  with your health care provider.  

## 2015-06-14 NOTE — Assessment & Plan Note (Addendum)
Chronic alcohol use, now currently alcohol abuse with heavy drinking x 3 months, clinically stable vitals and well appearing today without intoxication. Reported low BPs at home and poor PO. Likely mild dehydration and malnutrition from alcohol. - Comorbid with MDD, worsening symptoms - No clinical evidence of alcohol dependence or prior withdrawal  Plan: 1. Treat MDD 2. Counseling on reducing alcohol consumption by half to 3/4, then gradual taper off to avoid potential withdrawal 3. CSW substance resources given 4. Improve hydration, PO intake, start MVI, may continue hold anti-HTN meds if BP low at home, avoid dehydration / orthostatic 5. RTC follow-up

## 2015-06-14 NOTE — Progress Notes (Signed)
   Subjective:    Patient ID: Sabrina Powell, female    DOB: 1946-01-16, 69 y.o.   MRN: [id number]  Patient presents for a same day appointment. Patient is alone and provides all history, no family present today.  HPI  MAJOR DEPRESSION: - Previously diagnosed with Depression / Anxiety in setting of adjustment disorder 12/2013 from initial major life stressors. - Today with significant worsening symptoms with recent increased stressors and presents for follow-up, described that symptoms started in 12/2013 after she learned that her grandson had been arrested, and then again he was arrested weeks later. Described a long emotionally painful process with court hearings for up to 5 months. He [facility] in [facility], he was to serve 18 months, but he was able to leave early in May 2016 due to good behavior. Considered "sex offender", after coming out of of jail he now had difficulty finding place to live. This whole event has caused significant emotional stress on her. - Additionally she has quit her job due to threats initially that she did not have the appropriate qualifications, and was told she needed to be demoted after 18 years. However, was planning to retire - Admits depressed mood, sadness (most days), decreased energy, fatigue, decreased focused, tearful episodes, crying spells, weight lost >10 lbs in 3 months - PHQ-9: score 18 (very difficult) - No prior anti-depressant therapy tried. No counseling/therapy. - Denies any suicidal or homicidal ideation   ALCOHOL ABUSE / DEHYDRATION / HYPOTENSION: - Chronic history of alcohol use, but now admits to significant increase with alcohol abuse over past 3 months with heavy drinking. See above discussion regarding current depression, which triggered her coping with alcohol admittedly. Drinks multiple times daily mostly liquor, usually vodka, can drink half to whole pint of liquor in 24 hours, regularly every other day for past 3 months,  previously only more social drinker with regular wine consumption. - Recent reduced PO intake (poor appetite) - Denies active nausea / vomiting, abdominal pain  I have reviewed and updated the following as appropriate: allergies and current medications  Social Hx: - Alcohol abuse. No tobacco or other drugs.  Review of Systems  See above HPI    Objective:   Physical Exam  BP 129/62 mmHg  Pulse 101  Temp(Src) 98.3 F (36.8 C) (Oral)  Ht 5' (1.524 m)  Wt 112 lb 1.6 oz (50.848 kg)  BMI 21.89 kg/m2  Gen - thin, well-appearing, comfortable, and cooperative, NAD HEENT - NCAT, PERRL, EOMI, oropharynx clear, mild dry MM Neck - supple, non-tender, no thyromegaly Heart - RRR, no murmurs heard Lungs - CTAB, no wheezing, crackles, or rhonchi. Normal work of breathing. Abd - soft, NTND, no masses, +active BS Ext - non-tender, no edema, peripheral pulses intact +2 b/l Skin - warm, dry, no rashes Neuro - awake, alert, oriented x3, grossly non-focal, gait normal Psych - well groomed, congruent mood and affect, normal speech and thought content, good insight into condition     Assessment & Plan:   See specific A&P problem list for details.

## 2015-07-14 ENCOUNTER — Ambulatory Visit (INDEPENDENT_AMBULATORY_CARE_PROVIDER_SITE_OTHER): Payer: Medicare Other | Admitting: Family Medicine

## 2015-07-14 ENCOUNTER — Encounter: Payer: Self-pay | Admitting: Family Medicine

## 2015-07-14 VITALS — BP 153/58 | HR 115 | Temp 98.3°F | Ht 60.0 in | Wt 113.2 lb

## 2015-07-14 DIAGNOSIS — Z113 Encounter for screening for infections with a predominantly sexual mode of transmission: Secondary | ICD-10-CM | POA: Diagnosis not present

## 2015-07-14 DIAGNOSIS — F1010 Alcohol abuse, uncomplicated: Secondary | ICD-10-CM | POA: Diagnosis not present

## 2015-07-14 DIAGNOSIS — I10 Essential (primary) hypertension: Secondary | ICD-10-CM | POA: Diagnosis not present

## 2015-07-14 DIAGNOSIS — F322 Major depressive disorder, single episode, severe without psychotic features: Secondary | ICD-10-CM

## 2015-07-14 LAB — CBC
HCT: 29.7 % — ABNORMAL LOW (ref 36.0–46.0)
Hemoglobin: 9.7 g/dL — ABNORMAL LOW (ref 12.0–15.0)
MCH: 35.4 pg — ABNORMAL HIGH (ref 26.0–34.0)
MCHC: 32.7 g/dL (ref 30.0–36.0)
MCV: 108.4 fL — ABNORMAL HIGH (ref 78.0–100.0)
MPV: 11 fL (ref 8.6–12.4)
PLATELETS: 371 10*3/uL (ref 150–400)
RBC: 2.74 MIL/uL — AB (ref 3.87–5.11)
RDW: 15.8 % — ABNORMAL HIGH (ref 11.5–15.5)
WBC: 20 10*3/uL — AB (ref 4.0–10.5)

## 2015-07-14 LAB — COMPREHENSIVE METABOLIC PANEL
ALBUMIN: 3.3 g/dL — AB (ref 3.6–5.1)
ALK PHOS: 365 U/L — AB (ref 33–130)
ALT: 48 U/L — AB (ref 6–29)
AST: 140 U/L — AB (ref 10–35)
BUN: 8 mg/dL (ref 7–25)
CALCIUM: 8.9 mg/dL (ref 8.6–10.4)
CO2: 21 mmol/L (ref 20–31)
Chloride: 101 mmol/L (ref 98–110)
Creat: 0.7 mg/dL (ref 0.50–0.99)
Glucose, Bld: 103 mg/dL — ABNORMAL HIGH (ref 65–99)
Potassium: 3.8 mmol/L (ref 3.5–5.3)
Sodium: 138 mmol/L (ref 135–146)
TOTAL PROTEIN: 6.1 g/dL (ref 6.1–8.1)
Total Bilirubin: 1.3 mg/dL — ABNORMAL HIGH (ref 0.2–1.2)

## 2015-07-14 LAB — HIV ANTIBODY (ROUTINE TESTING W REFLEX): HIV 1&2 Ab, 4th Generation: NONREACTIVE

## 2015-07-14 LAB — HEPATITIS C ANTIBODY: HCV Ab: NEGATIVE

## 2015-07-14 LAB — TSH: TSH: 15.704 u[IU]/mL — AB (ref 0.350–4.500)

## 2015-07-14 MED ORDER — SERTRALINE HCL 100 MG PO TABS: 100.0000 mg | ORAL_TABLET | Freq: Every day | ORAL | Status: AC

## 2015-07-14 NOTE — Patient Instructions (Signed)
Alcohol Withdrawal °Anytime drug use is interfering with normal living activities it has become abuse. This includes problems with family and friends. Psychological dependence has developed when your mind tells you that the drug is needed. This is usually followed by physical dependence when a continuing increase of drugs are required to get the same feeling or "high." This is known as addiction or chemical dependency. A person's risk is much higher if there is a history of chemical dependency in the family. °Mild Withdrawal Following Stopping Alcohol, When Addiction or Chemical Dependency Has Developed °When a person has developed tolerance to alcohol, any sudden stopping of alcohol can cause uncomfortable physical symptoms. Most of the time these are mild and consist of tremors in the hands and increases in heart rate, breathing, and temperature. Sometimes these symptoms are associated with anxiety, panic attacks, and bad dreams. There may also be stomach upset. Normal sleep patterns are often interrupted with periods of inability to sleep (insomnia). This may last for 6 months. Because of this discomfort, many people choose to continue drinking to get rid of this discomfort and to try to feel normal. °Severe Withdrawal with Decreased or No Alcohol Intake, When Addiction or Chemical Dependency Has Developed °About five percent of alcoholics will develop signs of severe withdrawal when they stop using alcohol. One sign of this is development of generalized seizures (convulsions). Other signs of this are severe agitation and confusion. This may be associated with believing in things which are not real or seeing things which are not really there (delusions and hallucinations). Vitamin deficiencies are usually present if alcohol intake has been long-term. Treatment for this most often requires hospitalization and close observation. °Addiction can only be helped by stopping use of all chemicals. This is hard but may  save your life. With continual alcohol use, possible outcomes are usually loss of self respect and esteem, violence, and death. °Addiction cannot be cured but it can be stopped. This often requires outside help and the care of professionals. Treatment centers are listed in the yellow pages under Cocaine, Narcotics, and Alcoholics Anonymous. Most hospitals and clinics can refer you to a specialized care center. °It is not necessary for you to go through the uncomfortable symptoms of withdrawal. Your caregiver can provide you with medicines that will help you through this difficult period. Try to avoid situations, friends, or drugs that made it possible for you to keep using alcohol in the past. Learn how to say no. °It takes a long period of time to overcome addictions to all drugs, including alcohol. There may be many times when you feel as though you want a drink. After getting rid of the physical addiction and withdrawal, you will have a lessening of the craving which tells you that you need alcohol to feel normal. Call your caregiver if more support is needed. Learn who to talk to in your family and among your friends so that during these periods you can receive outside help. Alcoholics Anonymous (AA) has helped many people over the years. To get further help, contact AA or call your caregiver, counselor, or clergyperson. Al-Anon and Alateen are support groups for friends and family members of an alcoholic. The people who love and care for an alcoholic often need help, too. For information about these organizations, check your phone directory or call a local alcoholism treatment center.  °SEEK IMMEDIATE MEDICAL CARE IF:  °· You have a seizure. °· You have a fever. °· You experience uncontrolled vomiting or you   vomit up blood. This may be bright red or look like black coffee grounds. °· You have blood in the stool. This may be bright red or appear as a black, tarry, bad-smelling stool. °· You become lightheaded or  faint. Do not drive if you feel this way. Have someone else drive you or call 911 for help. °· You become more agitated or confused. °· You develop uncontrolled anxiety. °· You begin to see things that are not really there (hallucinate). °Your caregiver has determined that you completely understand your medical condition, and that your mental state is back to normal. You understand that you have been treated for alcohol withdrawal, have agreed not to drink any alcohol for a minimum of 1 day, will not operate a car or other machinery for 24 hours, and have had an opportunity to ask any questions about your condition. °Document Released: 08/07/2005 Document Revised: 01/20/2012 Document Reviewed: 06/15/2008 °ExitCare® Patient Information ©2015 [employer]. This information is not intended to replace advice given to you by your health care provider. Make sure you discuss any questions you have with your health care provider. ° °

## 2015-07-14 NOTE — Assessment & Plan Note (Signed)
Decreased from 1pint vodka daily to 1/2 half pint, some tremors and sweating, good support from daughter - follow-up with behavioral health (scheduled 10/14) - continue slow taper, cautioned about withdrawal symptoms and dangers - maintain adequate hydration, drinking 2-3 bottles of water daily

## 2015-07-14 NOTE — Assessment & Plan Note (Addendum)
Symptom improvement on sertraline, PHQ 14/27, still having lots of apathy, loss of appetite, poor concentration and forgetfulness - increase sertraline to  daily - continue slow alcohol taper and follow-up with behavioral health (scheduled 10/14) - checking TSH, CBC, CMP, HIV, Hep C - f/u in 1 month

## 2015-07-14 NOTE — Progress Notes (Signed)
   Subjective:   Sabrina Powell is a 69 y.o. female with a history of HTN here for f/u depression and alcohol abuse  Patient reports her depression symptoms have improved some since starting sertraline. She no longer sleeps all day. She still experiences apathy, loss of appetite, decreased concentration and forgetfulness. She is tolerating the medication well with some GI upset that is improving.  She has been able to cut down her alcohol use from about 1 pint/day of vodka to 1/2 pint. With this change she has had some shaky feeling that makes it difficult.  Review of Systems:  Per HPI. All other systems reviewed and are negative.   PMH, PSH, Medications, Allergies, and FmHx reviewed and updated in EMR.  Social History: former smoker  Objective:  BP 153/58 mmHg  Pulse 115  Temp(Src) 98.3 F (36.8 C) (Oral)  Ht 5' (1.524 m)  Wt 113 lb 3.2 oz (51.347 kg)  BMI 22.11 kg/m2  Gen:  69 y.o. female in NAD, mildly tremulous and anxious-appearing HEENT: NCAT, MMM, EOMI, PERRL, anicteric sclerae CV: tachycardic, regular rhythm, no MRG, no JVD Resp: Non-labored, CTAB, no wheezes noted Abd: Soft, NTND, BS present, no guarding or organomegaly Ext: WWP, no edema MSK: Full ROM, strength intact Neuro: Alert and oriented, speech normal      Chemistry      Component Value Date/Time   NA 135 02/14/2011 1030   K 4.3 02/14/2011 1030   CL 97 02/14/2011 1030   CO2 28 02/14/2011 1030   BUN 22 02/14/2011 1030   CREATININE 1.20 02/14/2011 1030      Component Value Date/Time   CALCIUM 8.6 02/14/2011 1030   ALKPHOS 70 11/26/2010 2149   AST 32 11/26/2010 2149   ALT 24 11/26/2010 2149   BILITOT 0.3 11/26/2010 2149      Lab Results  Component Value Date   WBC 10.5 02/13/2011   HGB 11.8* 02/13/2011   HCT 36.2 02/13/2011   MCV 99.2 02/13/2011   PLT 211 02/13/2011   Lab Results  Component Value Date   TSH 4.848 05/12/2007   No results found for: HGBA1C Assessment & Plan:     Sabrina Powell is a 69 y.o. female here for depression f/u  Major depressive disorder, single episode, severe Symptom improvement on sertraline, PHQ 14/27, still having lots of apathy, loss of appetite, poor concentration and forgetfulness - increase sertraline to  daily - continue slow alcohol taper and follow-up with behavioral health (scheduled 10/14) - checking TSH, CBC, CMP, HIV, Hep C - f/u in 1 month  Alcohol abuse Decreased from 1pint vodka daily to 1/2 half pint, some tremors and sweating, good support from daughter - follow-up with behavioral health (scheduled 10/14) - continue slow taper, cautioned about withdrawal symptoms and dangers - maintain adequate hydration, drinking 2-3 bottles of water daily    Beverely Low, MD, MPH [facility] PGY-3 07/14/2015 9:39 AM

## 2015-07-18 ENCOUNTER — Ambulatory Visit: Payer: Medicare Other | Admitting: Family Medicine

## 2015-07-19 ENCOUNTER — Encounter: Payer: Self-pay | Admitting: Family Medicine

## 2015-07-19 ENCOUNTER — Ambulatory Visit (INDEPENDENT_AMBULATORY_CARE_PROVIDER_SITE_OTHER): Payer: Medicare Other | Admitting: Family Medicine

## 2015-07-19 VITALS — BP 124/110 | HR 118 | Temp 98.2°F | Ht 60.0 in | Wt 115.0 lb

## 2015-07-19 DIAGNOSIS — R7989 Other specified abnormal findings of blood chemistry: Secondary | ICD-10-CM | POA: Diagnosis not present

## 2015-07-19 DIAGNOSIS — R000 Tachycardia, unspecified: Secondary | ICD-10-CM | POA: Insufficient documentation

## 2015-07-19 DIAGNOSIS — R946 Abnormal results of thyroid function studies: Secondary | ICD-10-CM

## 2015-07-19 DIAGNOSIS — I10 Essential (primary) hypertension: Secondary | ICD-10-CM | POA: Diagnosis not present

## 2015-07-19 DIAGNOSIS — E039 Hypothyroidism, unspecified: Secondary | ICD-10-CM | POA: Diagnosis not present

## 2015-07-19 DIAGNOSIS — R945 Abnormal results of liver function studies: Secondary | ICD-10-CM

## 2015-07-19 DIAGNOSIS — D539 Nutritional anemia, unspecified: Secondary | ICD-10-CM | POA: Diagnosis not present

## 2015-07-19 DIAGNOSIS — D72829 Elevated white blood cell count, unspecified: Secondary | ICD-10-CM | POA: Diagnosis not present

## 2015-07-19 MED ORDER — CARVEDILOL 6.25 MG PO TABS: 6.2500 mg | ORAL_TABLET | Freq: Two times a day (BID) | ORAL | Status: AC

## 2015-07-19 NOTE — Assessment & Plan Note (Addendum)
HR 118 today and 115 last week, anxious appearing, likely multifactorial and related to anxiety +/- alcohol withdrawal +/- beta blocker withdrawal - restart coreg at lower dose, 6.25mg  bid - f/u in 1-2 weeks - cautioned again about alcohol withdrawal

## 2015-07-19 NOTE — Assessment & Plan Note (Signed)
Depression, constipation, hair loss, easy bruising, cold intolerance, TSH 15.7, mother and daughter with hypothyroidism - repeat TSH and free T4 today

## 2015-07-19 NOTE — Assessment & Plan Note (Signed)
Hgb 9.7, MCV 108, alcoholic with poor appetite, likely B12 deficiency (b12 and folate normal when last checked in 2012) - repeat CBC with dif today - check B12, folate, ferritin

## 2015-07-19 NOTE — Progress Notes (Signed)
Subjective:   Sabrina Powell is a 68 y.o. female with a history of alcoholism and depression here for f/u lab abnormalities  Pt presents to follow-up multiple lab abnormalities. Reports still drinking about 1/2 pint vodka daily, depression improving on zoloft.   For the past few years she has noticed her hair is thinning and she bruises more easily, she is somewhat cold intolerant and often constipated. She is losing weight recently which she attributes to having no appetite with her depression.   Review of Systems:  Per HPI. All other systems reviewed and are negative.   PMH, PSH, Medications, Allergies, and FmHx reviewed and updated in EMR.  Social History: former smoker  Objective:  BP 124/110 mmHg  Pulse 118  Temp(Src) 98.2 F (36.8 C) (Oral)  Ht 5' (1.524 m)  Wt 115 lb (52.164 kg)  BMI 22.46 kg/m2  Gen:  68 y.o. female in NAD HEENT: NCAT, MMM, EOMI, PERRL, anicteric sclerae CV: tachycardic, regular rhythm, no MRG, no JVD Resp: Non-labored, CTAB, no wheezes noted Abd: Soft, NTND, BS present, no guarding or organomegaly Ext: WWP, no edema MSK: Full ROM, strength intact Neuro: Alert and oriented, speech normal      Chemistry      Component Value Date/Time   NA 138 07/14/2015 0927   K 3.8 07/14/2015 0927   CL 101 07/14/2015 0927   CO2 21 07/14/2015 0927   BUN 8 07/14/2015 0927   CREATININE 0.70 07/14/2015 0927   CREATININE 1.20 02/14/2011 1030      Component Value Date/Time   CALCIUM 8.9 07/14/2015 0927   ALKPHOS 365* 07/14/2015 0927   AST 140* 07/14/2015 0927   ALT 48* 07/14/2015 0927   BILITOT 1.3* 07/14/2015 0927      Lab Results  Component Value Date   WBC 20.0* 07/14/2015   HGB 9.7* 07/14/2015   HCT 29.7* 07/14/2015   MCV 108.4* 07/14/2015   PLT 371 07/14/2015   Lab Results  Component Value Date   TSH 15.704* 07/14/2015   No results found for: HGBA1C Assessment & Plan:     Sabrina Powell is a 68 y.o. female here for f/u abnormal  labs  HYPERTENSION, BENIGN ESSENTIAL Patient off meds >1 month, BP ok at home, elevated here, tachycardic - restart coreg at 6.25mg bid - f/u in 1 week  Anemia, macrocytic Hgb 9.7, MCV 108, alcoholic with poor appetite, likely B12 deficiency (b12 and folate normal when last checked in 2012) - repeat CBC with dif today - check B12, folate, ferritin  Elevated LFTs Expect transaminitis and alk phos to 365 related to alcohol use, hep c negative - consider EGD for varices and RUQ u/s for likely steatosis/cirrhosis - will defer further work-up today given other issues and patient working hard on alcohol cessation at this time  Elevated TSH Depression, constipation, hair loss, easy bruising, cold intolerance, TSH 15.7, mother and daughter with hypothyroidism - repeat TSH and free T4 today   Leukocytosis No current infectious symptoms, incidental finding - repeat CBC with dif today  Tachycardia HR 118 today and 115 last week, anxious appearing, likely multifactorial and related to anxiety +/- alcohol withdrawal +/- beta blocker withdrawal - restart coreg at lower dose, 6.25mg bid - f/u in 1-2 weeks - cautioned again about alcohol withdrawal     Elena Adamo, MD, MPH [facility] PGY-3 07/19/2015 4:05 PM     

## 2015-07-19 NOTE — Assessment & Plan Note (Signed)
Expect transaminitis and alk phos to 365 related to alcohol use, hep c negative - consider EGD for varices and RUQ u/s for likely steatosis/cirrhosis - will defer further work-up today given other issues and patient working hard on alcohol cessation at this time

## 2015-07-19 NOTE — Assessment & Plan Note (Signed)
No current infectious symptoms, incidental finding - repeat CBC with dif today

## 2015-07-19 NOTE — Assessment & Plan Note (Signed)
Patient off meds >1 month, BP ok at home, elevated here, tachycardic - restart coreg at 6.25mg  bid - f/u in 1 week

## 2015-07-19 NOTE — Patient Instructions (Signed)
Alcoholic Liver Disease °Alcoholic liver disease means you have a damaged liver that does not work properly. This disease is caused by drinking too much alcohol. Some people do not have any problems until the disease has become very bad. Problems can be worse after a period of heavy drinking. °HOME CARE °· Stop drinking alcohol. °· Get expert advice or help (counseling) to quit drinking. Join an alchohol support group. °· You may need to eat foods that give you energy (carbohydrates), such as: °¨ Milk and yogurt. °¨ Navy, pinto, and white beans. °¨ Applesauce, grapes, dried dates, prunes, and raisins. °¨ Potatoes and rice. °· Eat foods that are high in vitamins, especially thiamine and folic acid. These foods include: °¨ Whole-wheat or whole-grain breads and cereals. Look on the package for "added thiamine" or "added folic acid." °¨ Meat, especially pork. °¨ Fresh, raw vegetables. °¨ Fresh fruits and vegetables, such as oranges, orange juice, avocados, beets, and cantaloupe. °¨ Dark green, leafy vegetables, such as romaine lettuce, spinach, and broccoli. °¨ Beans and nuts. °¨ Dairy foods, such as milk, butter, yogurt, cheese, and ice cream. °GET HELP RIGHT AWAY IF:  °· You have bright red blood in your poop (stool). °· You cough or throw up (vomit) blood. °· Your skin and eyes turn more yellow. °· You have a bad headache or problems thinking. °· You have trouble walking. °MAKE SURE YOU:  °· Understand these instructions. °· Will watch your condition. °· Will get help right away if you are not doing well or get worse. °Document Released: 08/25/2009 Document Revised: 01/20/2012 Document Reviewed: 08/25/2009 °ExitCare® Patient Information ©2015 [employer]. This information is not intended to replace advice given to you by your health care provider. Make sure you discuss any questions you have with your health care provider. ° °

## 2015-07-20 LAB — CBC WITH DIFFERENTIAL/PLATELET
BASOS ABS: 0 10*3/uL (ref 0.0–0.1)
Basophils Relative: 0 % (ref 0–1)
Eosinophils Absolute: 0.3 10*3/uL (ref 0.0–0.7)
Eosinophils Relative: 1 % (ref 0–5)
HEMATOCRIT: 29.3 % — AB (ref 36.0–46.0)
HEMOGLOBIN: 9.6 g/dL — AB (ref 12.0–15.0)
LYMPHS PCT: 8 % — AB (ref 12–46)
Lymphs Abs: 2 10*3/uL (ref 0.7–4.0)
MCH: 35.7 pg — ABNORMAL HIGH (ref 26.0–34.0)
MCHC: 32.8 g/dL (ref 30.0–36.0)
MCV: 108.9 fL — ABNORMAL HIGH (ref 78.0–100.0)
MPV: 10.9 fL (ref 8.6–12.4)
Monocytes Absolute: 1.8 10*3/uL — ABNORMAL HIGH (ref 0.1–1.0)
Monocytes Relative: 7 % (ref 3–12)
NEUTROS ABS: 21.5 10*3/uL — AB (ref 1.7–7.7)
NEUTROS PCT: 84 % — AB (ref 43–77)
Platelets: 417 10*3/uL — ABNORMAL HIGH (ref 150–400)
RBC: 2.69 MIL/uL — AB (ref 3.87–5.11)
RDW: 16.2 % — AB (ref 11.5–15.5)
WBC: 25.6 10*3/uL — AB (ref 4.0–10.5)

## 2015-07-20 LAB — TSH: TSH: 16.118 u[IU]/mL — ABNORMAL HIGH (ref 0.350–4.500)

## 2015-07-20 LAB — VITAMIN B12

## 2015-07-20 LAB — T4, FREE: FREE T4: 0.7 ng/dL — AB (ref 0.80–1.80)

## 2015-07-20 LAB — FERRITIN: Ferritin: 1021 ng/mL — ABNORMAL HIGH (ref 10–291)

## 2015-07-20 LAB — FOLATE: Folate: >20 ng/mL

## 2015-07-31 ENCOUNTER — Telehealth: Payer: Self-pay | Admitting: *Deleted

## 2015-07-31 NOTE — Telephone Encounter (Signed)
Patient daughter calling with concerns about her mother, states she is having frequent falls, diarrhea, she is not eating, and her abdomen in distended. Daughter also states her depression is worse. Daughter was hoping we could call mother and get her to come in for an appointment. She also says she feels her mother should be admitted for liquid nutrition. Will forward to PCP.

## 2015-07-31 NOTE — Telephone Encounter (Signed)
-----   Message from Abram Sander, MD sent at 07/25/2015  9:05 AM EDT ----- Please let patient know that her labs remain abnormal, as discussed previously, and I would like to see her on Friday to discuss further. You can double book her, preferably early afternoon. Thanks!

## 2015-07-31 NOTE — Telephone Encounter (Signed)
Left message on voicemail for patient to call back regarding labs and to schedule an appointment

## 2015-08-08 ENCOUNTER — Inpatient Hospital Stay (HOSPITAL_COMMUNITY)
Admission: AD | Admit: 2015-08-08 | Discharge: 2015-09-12 | DRG: 981 | Disposition: E | Payer: Medicare Other | Source: Ambulatory Visit | Attending: Family Medicine | Admitting: Family Medicine

## 2015-08-08 ENCOUNTER — Encounter: Payer: Self-pay | Admitting: Family Medicine

## 2015-08-08 ENCOUNTER — Encounter (HOSPITAL_COMMUNITY): Payer: Self-pay | Admitting: General Practice

## 2015-08-08 ENCOUNTER — Ambulatory Visit: Payer: Medicare Other | Admitting: Family Medicine

## 2015-08-08 VITALS — BP 150/64 | HR 116 | Temp 98.2°F | Ht 60.0 in | Wt 118.0 lb

## 2015-08-08 DIAGNOSIS — D638 Anemia in other chronic diseases classified elsewhere: Secondary | ICD-10-CM | POA: Diagnosis present

## 2015-08-08 DIAGNOSIS — F419 Anxiety disorder, unspecified: Secondary | ICD-10-CM | POA: Diagnosis present

## 2015-08-08 DIAGNOSIS — I639 Cerebral infarction, unspecified: Secondary | ICD-10-CM | POA: Diagnosis not present

## 2015-08-08 DIAGNOSIS — F1010 Alcohol abuse, uncomplicated: Secondary | ICD-10-CM | POA: Diagnosis not present

## 2015-08-08 DIAGNOSIS — J9601 Acute respiratory failure with hypoxia: Secondary | ICD-10-CM | POA: Diagnosis not present

## 2015-08-08 DIAGNOSIS — N179 Acute kidney failure, unspecified: Secondary | ICD-10-CM | POA: Diagnosis not present

## 2015-08-08 DIAGNOSIS — Z66 Do not resuscitate: Secondary | ICD-10-CM | POA: Diagnosis not present

## 2015-08-08 DIAGNOSIS — R5383 Other fatigue: Secondary | ICD-10-CM | POA: Diagnosis present

## 2015-08-08 DIAGNOSIS — J9690 Respiratory failure, unspecified, unspecified whether with hypoxia or hypercapnia: Secondary | ICD-10-CM | POA: Diagnosis not present

## 2015-08-08 DIAGNOSIS — E871 Hypo-osmolality and hyponatremia: Secondary | ICD-10-CM | POA: Diagnosis present

## 2015-08-08 DIAGNOSIS — E46 Unspecified protein-calorie malnutrition: Secondary | ICD-10-CM | POA: Diagnosis present

## 2015-08-08 DIAGNOSIS — R001 Bradycardia, unspecified: Secondary | ICD-10-CM | POA: Diagnosis not present

## 2015-08-08 DIAGNOSIS — E7220 Disorder of urea cycle metabolism, unspecified: Secondary | ICD-10-CM | POA: Diagnosis not present

## 2015-08-08 DIAGNOSIS — Z4659 Encounter for fitting and adjustment of other gastrointestinal appliance and device: Secondary | ICD-10-CM

## 2015-08-08 DIAGNOSIS — E785 Hyperlipidemia, unspecified: Secondary | ICD-10-CM | POA: Diagnosis present

## 2015-08-08 DIAGNOSIS — Y92230 Patient room in hospital as the place of occurrence of the external cause: Secondary | ICD-10-CM | POA: Diagnosis not present

## 2015-08-08 DIAGNOSIS — K921 Melena: Secondary | ICD-10-CM | POA: Diagnosis not present

## 2015-08-08 DIAGNOSIS — D509 Iron deficiency anemia, unspecified: Secondary | ICD-10-CM | POA: Diagnosis present

## 2015-08-08 DIAGNOSIS — R34 Anuria and oliguria: Secondary | ICD-10-CM | POA: Diagnosis not present

## 2015-08-08 DIAGNOSIS — Z515 Encounter for palliative care: Secondary | ICD-10-CM | POA: Diagnosis not present

## 2015-08-08 DIAGNOSIS — G935 Compression of brain: Secondary | ICD-10-CM | POA: Diagnosis not present

## 2015-08-08 DIAGNOSIS — Z7982 Long term (current) use of aspirin: Secondary | ICD-10-CM

## 2015-08-08 DIAGNOSIS — K7030 Alcoholic cirrhosis of liver without ascites: Secondary | ICD-10-CM | POA: Diagnosis present

## 2015-08-08 DIAGNOSIS — E872 Acidosis: Secondary | ICD-10-CM | POA: Diagnosis not present

## 2015-08-08 DIAGNOSIS — D649 Anemia, unspecified: Secondary | ICD-10-CM | POA: Insufficient documentation

## 2015-08-08 DIAGNOSIS — J4530 Mild persistent asthma, uncomplicated: Secondary | ICD-10-CM | POA: Diagnosis present

## 2015-08-08 DIAGNOSIS — Z9289 Personal history of other medical treatment: Secondary | ICD-10-CM

## 2015-08-08 DIAGNOSIS — W19XXXA Unspecified fall, initial encounter: Secondary | ICD-10-CM | POA: Diagnosis not present

## 2015-08-08 DIAGNOSIS — Z9181 History of falling: Secondary | ICD-10-CM | POA: Diagnosis not present

## 2015-08-08 DIAGNOSIS — R945 Abnormal results of liver function studies: Secondary | ICD-10-CM

## 2015-08-08 DIAGNOSIS — R188 Other ascites: Secondary | ICD-10-CM | POA: Insufficient documentation

## 2015-08-08 DIAGNOSIS — D72829 Elevated white blood cell count, unspecified: Secondary | ICD-10-CM | POA: Diagnosis present

## 2015-08-08 DIAGNOSIS — S065X0A Traumatic subdural hemorrhage without loss of consciousness, initial encounter: Secondary | ICD-10-CM | POA: Diagnosis not present

## 2015-08-08 DIAGNOSIS — R739 Hyperglycemia, unspecified: Secondary | ICD-10-CM | POA: Diagnosis present

## 2015-08-08 DIAGNOSIS — Z7951 Long term (current) use of inhaled steroids: Secondary | ICD-10-CM | POA: Diagnosis not present

## 2015-08-08 DIAGNOSIS — R000 Tachycardia, unspecified: Secondary | ICD-10-CM | POA: Diagnosis present

## 2015-08-08 DIAGNOSIS — K7290 Hepatic failure, unspecified without coma: Secondary | ICD-10-CM | POA: Diagnosis not present

## 2015-08-08 DIAGNOSIS — R569 Unspecified convulsions: Secondary | ICD-10-CM | POA: Diagnosis not present

## 2015-08-08 DIAGNOSIS — S065XAA Traumatic subdural hemorrhage with loss of consciousness status unknown, initial encounter: Secondary | ICD-10-CM | POA: Diagnosis not present

## 2015-08-08 DIAGNOSIS — D539 Nutritional anemia, unspecified: Secondary | ICD-10-CM | POA: Diagnosis present

## 2015-08-08 DIAGNOSIS — E039 Hypothyroidism, unspecified: Secondary | ICD-10-CM | POA: Diagnosis present

## 2015-08-08 DIAGNOSIS — K7031 Alcoholic cirrhosis of liver with ascites: Secondary | ICD-10-CM | POA: Diagnosis not present

## 2015-08-08 DIAGNOSIS — F10239 Alcohol dependence with withdrawal, unspecified: Secondary | ICD-10-CM | POA: Diagnosis present

## 2015-08-08 DIAGNOSIS — I10 Essential (primary) hypertension: Secondary | ICD-10-CM | POA: Diagnosis present

## 2015-08-08 DIAGNOSIS — I6200 Nontraumatic subdural hemorrhage, unspecified: Secondary | ICD-10-CM | POA: Diagnosis not present

## 2015-08-08 DIAGNOSIS — E876 Hypokalemia: Secondary | ICD-10-CM | POA: Diagnosis present

## 2015-08-08 DIAGNOSIS — F329 Major depressive disorder, single episode, unspecified: Secondary | ICD-10-CM | POA: Diagnosis present

## 2015-08-08 DIAGNOSIS — G9340 Encephalopathy, unspecified: Secondary | ICD-10-CM | POA: Diagnosis not present

## 2015-08-08 DIAGNOSIS — Z87891 Personal history of nicotine dependence: Secondary | ICD-10-CM

## 2015-08-08 DIAGNOSIS — R4020 Unspecified coma: Secondary | ICD-10-CM | POA: Diagnosis not present

## 2015-08-08 DIAGNOSIS — R7989 Other specified abnormal findings of blood chemistry: Secondary | ICD-10-CM

## 2015-08-08 DIAGNOSIS — Z79899 Other long term (current) drug therapy: Secondary | ICD-10-CM | POA: Diagnosis not present

## 2015-08-08 DIAGNOSIS — S065X9A Traumatic subdural hemorrhage with loss of consciousness of unspecified duration, initial encounter: Secondary | ICD-10-CM | POA: Diagnosis not present

## 2015-08-08 DIAGNOSIS — Z09 Encounter for follow-up examination after completed treatment for conditions other than malignant neoplasm: Secondary | ICD-10-CM

## 2015-08-08 DIAGNOSIS — R531 Weakness: Secondary | ICD-10-CM

## 2015-08-08 DIAGNOSIS — Z01818 Encounter for other preprocedural examination: Secondary | ICD-10-CM

## 2015-08-08 HISTORY — DX: Pneumonia, unspecified organism: J18.9

## 2015-08-08 HISTORY — DX: Reserved for inherently not codable concepts without codable children: IMO0001

## 2015-08-08 HISTORY — DX: Hypothyroidism, unspecified: E03.9

## 2015-08-08 HISTORY — DX: Major depressive disorder, single episode, unspecified: F32.9

## 2015-08-08 HISTORY — DX: Alcohol dependence, uncomplicated: F10.20

## 2015-08-08 HISTORY — DX: Depression, unspecified: F32.A

## 2015-08-08 HISTORY — DX: Anxiety disorder, unspecified: F41.9

## 2015-08-08 LAB — CBC WITH DIFFERENTIAL/PLATELET
BASOS ABS: 0 10*3/uL (ref 0.0–0.1)
Basophils Relative: 0 %
EOS ABS: 0.5 10*3/uL (ref 0.0–0.7)
Eosinophils Relative: 2 %
HCT: 26.3 % — ABNORMAL LOW (ref 36.0–46.0)
HEMOGLOBIN: 8.6 g/dL — AB (ref 12.0–15.0)
LYMPHS PCT: 9 %
Lymphs Abs: 2.3 10*3/uL (ref 0.7–4.0)
MCH: 36.1 pg — AB (ref 26.0–34.0)
MCHC: 32.7 g/dL (ref 30.0–36.0)
MCV: 110.5 fL — ABNORMAL HIGH (ref 78.0–100.0)
MONOS PCT: 5 %
Monocytes Absolute: 1.3 10*3/uL — ABNORMAL HIGH (ref 0.1–1.0)
NEUTROS ABS: 21.5 10*3/uL — AB (ref 1.7–7.7)
NEUTROS PCT: 84 %
PLATELETS: 323 10*3/uL (ref 150–400)
RBC: 2.38 MIL/uL — AB (ref 3.87–5.11)
RDW: 20.2 % — ABNORMAL HIGH (ref 11.5–15.5)
WBC: 25.6 10*3/uL — AB (ref 4.0–10.5)

## 2015-08-08 LAB — PROTIME-INR
INR: 1.33 (ref 0.00–1.49)
PROTHROMBIN TIME: 16.6 s — AB (ref 11.6–15.2)

## 2015-08-08 LAB — COMPREHENSIVE METABOLIC PANEL
ALBUMIN: 1.8 g/dL — AB (ref 3.5–5.0)
ALK PHOS: 537 U/L — AB (ref 38–126)
ALT: 46 U/L (ref 14–54)
ANION GAP: 13 (ref 5–15)
AST: 196 U/L — AB (ref 15–41)
BILIRUBIN TOTAL: 3.7 mg/dL — AB (ref 0.3–1.2)
BUN: 19 mg/dL (ref 6–20)
CHLORIDE: 101 mmol/L (ref 101–111)
CO2: 18 mmol/L — AB (ref 22–32)
CREATININE: 0.96 mg/dL (ref 0.44–1.00)
Calcium: 7.3 mg/dL — ABNORMAL LOW (ref 8.9–10.3)
Glucose, Bld: 613 mg/dL (ref 65–99)
POTASSIUM: 3.3 mmol/L — AB (ref 3.5–5.1)
Sodium: 132 mmol/L — ABNORMAL LOW (ref 135–145)
Total Protein: 5.1 g/dL — ABNORMAL LOW (ref 6.5–8.1)

## 2015-08-08 LAB — PHOSPHORUS: PHOSPHORUS: 2.9 mg/dL (ref 2.5–4.6)

## 2015-08-08 LAB — GLUCOSE, CAPILLARY: Glucose-Capillary: 137 mg/dL — ABNORMAL HIGH (ref 65–99)

## 2015-08-08 LAB — MAGNESIUM: MAGNESIUM: 1.7 mg/dL (ref 1.7–2.4)

## 2015-08-08 MED ORDER — DEXTROSE-NACL 5-0.45 % IV SOLN
INTRAVENOUS | Status: DC
  Administered 2015-08-08 – 2015-08-09 (×2): via INTRAVENOUS

## 2015-08-08 MED ORDER — SPIRONOLACTONE 100 MG PO TABS
100.0000 mg | ORAL_TABLET | Freq: Every day | ORAL | Status: DC
  Filled 2015-08-08: qty 4

## 2015-08-08 MED ORDER — FUROSEMIDE 40 MG PO TABS
40.0000 mg | ORAL_TABLET | Freq: Every day | ORAL | Status: DC
  Filled 2015-08-08: qty 1

## 2015-08-08 MED ORDER — ASPIRIN EC 81 MG PO TBEC: 81.0000 mg | DELAYED_RELEASE_TABLET | Freq: Every day | ORAL | Status: DC

## 2015-08-08 MED ORDER — BUDESONIDE 0.25 MG/2ML IN SUSP
0.2500 mg | Freq: Two times a day (BID) | RESPIRATORY_TRACT | Status: DC
Start: 2015-08-08 — End: 2015-08-09
  Administered 2015-08-08 – 2015-08-09 (×2): 0.25 mg via RESPIRATORY_TRACT
  Filled 2015-08-08 (×2): qty 2

## 2015-08-08 MED ORDER — INFLUENZA VAC SPLIT QUAD 0.5 ML IM SUSY
0.5000 mL | PREFILLED_SYRINGE | INTRAMUSCULAR | Status: DC
  Filled 2015-08-08: qty 0.5

## 2015-08-08 MED ORDER — ENOXAPARIN SODIUM 40 MG/0.4ML ~~LOC~~ SOLN
40.0000 mg | SUBCUTANEOUS | Status: DC
Start: 2015-08-09 — End: 2015-08-09
  Administered 2015-08-09: 40 mg via SUBCUTANEOUS
  Filled 2015-08-08: qty 0.4

## 2015-08-08 MED ORDER — CARVEDILOL 6.25 MG PO TABS
6.2500 mg | ORAL_TABLET | Freq: Two times a day (BID) | ORAL | Status: DC
  Administered 2015-08-08 – 2015-08-09 (×2): 6.25 mg via ORAL
  Filled 2015-08-08 (×2): qty 1

## 2015-08-08 MED ORDER — VITAMIN B-1 100 MG PO TABS
100.0000 mg | ORAL_TABLET | Freq: Every day | ORAL | Status: DC
  Administered 2015-08-08: 100 mg via ORAL
  Filled 2015-08-08: qty 1

## 2015-08-08 MED ORDER — THIAMINE HCL 100 MG/ML IJ SOLN: 100.0000 mg | Freq: Every day | INTRAMUSCULAR | Status: DC

## 2015-08-08 MED ORDER — ADULT MULTIVITAMIN W/MINERALS CH
1.0000 | ORAL_TABLET | Freq: Every day | ORAL | Status: DC
  Administered 2015-08-08: 1 via ORAL
  Filled 2015-08-08 (×2): qty 1

## 2015-08-08 MED ORDER — LORAZEPAM 2 MG/ML IJ SOLN: 1.0000 mg | Freq: Four times a day (QID) | INTRAMUSCULAR | Status: DC | PRN

## 2015-08-08 MED ORDER — CALCIUM CARBONATE 1250 (500 CA) MG PO TABS
1500.0000 mg | ORAL_TABLET | Freq: Every day | ORAL | Status: DC
  Filled 2015-08-08: qty 1

## 2015-08-08 MED ORDER — SODIUM CHLORIDE 0.9 % IJ SOLN
3.0000 mL | Freq: Two times a day (BID) | INTRAMUSCULAR | Status: DC
  Administered 2015-08-08 – 2015-08-10 (×4): 3 mL via INTRAVENOUS

## 2015-08-08 MED ORDER — ENSURE ENLIVE PO LIQD: 237.0000 mL | Freq: Two times a day (BID) | ORAL | Status: DC

## 2015-08-08 MED ORDER — FLUTICASONE PROPIONATE 50 MCG/ACT NA SUSP: 1.0000 | Freq: Every day | NASAL | Status: DC

## 2015-08-08 MED ORDER — ALBUTEROL SULFATE (2.5 MG/3ML) 0.083% IN NEBU
3.0000 mL | INHALATION_SOLUTION | RESPIRATORY_TRACT | Status: DC | PRN
  Administered 2015-08-09: 3 mL via RESPIRATORY_TRACT
  Filled 2015-08-08: qty 3

## 2015-08-08 MED ORDER — SERTRALINE HCL 100 MG PO TABS: 100.0000 mg | ORAL_TABLET | Freq: Every day | ORAL | Status: DC

## 2015-08-08 MED ORDER — CALCIUM CARBONATE 1250 (500 CA) MG PO TABS
1.0000 | ORAL_TABLET | Freq: Every day | ORAL | Status: DC
  Administered 2015-08-09: 500 mg via ORAL
  Filled 2015-08-08 (×2): qty 1

## 2015-08-08 MED ORDER — POTASSIUM CHLORIDE CRYS ER 20 MEQ PO TBCR
20.0000 meq | EXTENDED_RELEASE_TABLET | Freq: Two times a day (BID) | ORAL | Status: DC
  Administered 2015-08-09: 20 meq via ORAL
  Filled 2015-08-08 (×2): qty 1

## 2015-08-08 MED ORDER — LORAZEPAM 1 MG PO TABS
1.0000 mg | ORAL_TABLET | Freq: Four times a day (QID) | ORAL | Status: DC | PRN
  Administered 2015-08-09 (×2): 1 mg via ORAL
  Filled 2015-08-08 (×2): qty 1

## 2015-08-08 NOTE — H&P (Addendum)
[facility] ATTENDING ADMISSION NOTE Kehinde Eniola,MD I  have seen and examined this patient, reviewed their chart. I have discussed this patient with the resident and Dr Nori Riis.   69 Y/O F with PMX of MDD, ETOH abuse, elevated liver enzymes, HTN, HLD, Asthma, presented with few days hx of fatigue, hand tremors and loss of appetite for few weeks. Patient denies any abdominal pain, no blood in stool, feels nauseous occasionally but no recent vomiting, she endorsed abdominal bloating. The patient stated she has not been eating well now for well over a month, but she has been drinking 1/2 pint of vodka daily, her last drink was early this morning. She started drinking about 3 years ago due to an event that occurred in her family which made her very depressed; this worsened recently when she also lost her job. She has been trying to quite drinking but each time she tries she becomes jittery. She also endorsed B/L leg swelling worsening over the past few weeks. She denies SOB, no chest pain. She denies fever.She also mentioned she has been falling more frequently at home . Lately, she thought this was due to Lexapro which she recently started for depression, so she d/c it. Patient had a follow-up with her PCP today for abnormal lab report and was found to be unstable hence admission was recommended.  No current facility-administered medications on file prior to encounter.   Current Outpatient Prescriptions on File Prior to Encounter  Medication Sig Dispense Refill  . albuterol (PROAIR HFA) 108 (90 BASE) MCG/ACT inhaler 2 puffs inhaled every 4 hours as needed shortness of breath.  Dispense generic. 18 g 6  . aspirin EC 81 MG tablet Take 1 tablet (81 mg total) by mouth daily. 90 tablet 3  . Budesonide (PULMICORT FLEXHALER) 90 MCG/ACT inhaler Inhale 1 puff into the lungs 2 (two) times daily. regularly 3 Inhaler 3  . Calcium Carbonate 1500 MG TABS daily.     . carvedilol (COREG) 6.25 MG tablet Take 1 tablet (6.25 mg  total) by mouth 2 (two) times daily with a meal. 60 tablet 2  . fluticasone (FLONASE) 50 MCG/ACT nasal spray Place 1 spray into the nose daily. 16 g 2  . fluticasone (FLOVENT HFA) 110 MCG/ACT inhaler Inhale 1 puff into the lungs 2 (two) times daily. 3 Inhaler 3  . Multiple Vitamin (MULTIVITAMIN) capsule Take 1 capsule by mouth daily.      . Omega-3 Fatty Acids (FISH OIL) 1200 MG CAPS Take 2 tabs daily     . sertraline (ZOLOFT) 100 MG tablet Take 1 tablet (100 mg total) by mouth daily. 30 tablet 1   Past Medical History  Diagnosis Date  . HTN (hypertension)   . HLD (hyperlipidemia)   . Overweight(278.02)   . Allergic rhinitis   . Asthma, mild persistent   . IDA (iron deficiency anemia)   . Pneumonia 1994  . Hypothyroidism   . Depression   . Anxiety    Filed Vitals:   07/24/2015 1640 08/06/2015 1700  BP: 124/56   Pulse: 111   Temp: 98.3 F (36.8 C)   TempSrc: Oral   Resp: 22   Height:  5' (1.524 m)  Weight:  118 lb (53.524 kg)  SpO2: 97%    Exam: Gen: Awake and alert,in moderate distress. Mildly tremulous.  Neuro: Oriented x 3, CNII-XII intact, ++DTR. Gait not assessed. HEENT: EOMI, PERRLA, mild sclera jaundice. Resp: Air entry equal and CTA B/L. HEART: S1 S2 normal, no murmur, rapid rate,  normal rhythm. Abd: Distended (Ascites)  and tense, no tenderness, ++ bowel sound normoactive, I could not palpate the liver due to the way she was positioned. Plan to reassess by in-patient team. Ext: ++ B/L pitting edema.  A/P:  69 Y/O F with 1. Transaminates/ETOH abuse/ dependence and ascites.     Out patient lab shows AST greater than ALT which may be suggestive of alcoholic hepatitis.     Alk Phos also significantly elevated ( ?? Underlying bone disease vs hyperparathyroidism).     Due to elevated Ferritin > 300, I also worry about hemachromatosis, although this can also be elevated in alcoholic cirrhosis.     Plan to admit to hospital.     Obtain hepatitis panel.     Review hepatitis  vaccination status and update as appropriate.     Obtain RUQ U/S vs Liver MRI if really worried about hemachromatosis.     Check CBC and coagulation profile.     CIWA protocol for alcoholism. MVI, Thiamine and Folate.     Seizure and fall precaution to be implemented.     Consider ascitic fluid tap for diagnostic/therapeutic purpose.     He will benefit from GI consult given test result as well.  2. Ascites/Pedal swelling: Likely part of her Liver disease sequela.     Evaluation as above.     Consider paracentesis as above.     If her BP tolerates it she will benefit from Spironolactone and Lasix 100:11m ratio.     Monitor I&Os.     Fluid and salt restriction per protocol.     ECHO to assess cardiac function if not recently done.  3. Elevated WBC: Patient afebrile     ?? SBP although no fever.     Ascitic fluid analysis will be beneficial in this setting.     Obtain UA, urine culture and blood culture.     A/B coverage pending full evaluation recommended.  4. Hyperglycemia: No prior hx of DM2. ?? Lab error.     Anion gap of 13 is reassuring.     Plan to repeat lab.     Obtain A1C.     SSI pending repeat lab.  5. Hypothyroidism: TSH checked at the clinic significantly elevated.     Start Synthroid.     Recommending full endocrine work-up.     A1C.     Screening for hyperparathyroidism in the setting of elevated Alk Phos.     Obtain intact PTH, serum Vit D level and phosphorus.  6. HTN/HLD: Review home regimen and restart when appropriate.       7. Electrolyte imbalance: Hyponatremia, hypokalemia.     Replace potassium.     Fluid restriction for hyponatremia.     Recheck in the morning.  8. Anemia: Gradual declined in hemoglobin level from baseline (11-8).     Obtain FOBT.     Recheck CBC in AM.     Transfusion threshold of less than 7.  Signed off to Dr NNori Riistoday at 5 pm.

## 2015-08-08 NOTE — H&P (Signed)
[facility] Admission History and Physical Service Pager: [phone]  Patient name: Sabrina Powell Medical record number: [id number] Date of birth: 11-Feb-1946 Age: 69 y.o. Gender: female  Primary Care Provider: Beverlyn Roux, MD Consultants: None Code Status: FULL  Chief Complaint: alcohol detox  Assessment and Plan: Sabrina Powell is a 69 y.o. female presenting with alcohol withdrawal and malnutrition. PMH is significant for alcohol abuse, elevated liver enzymes, HTN, HLD hypothyroidism, asthma, mood disorder, and anxiety.  Alcohol abuse: Acute on chronic, recently tapered down to 1/2 pint per day, last drink 9/27 at ~12:00pm. Hemodynamically stable.  - CIWA protocol - MVI, thiamine and folate - Seizure and fall precautions: Avoid tramadol - Continue to develop more robust plan for continued abstinence following discharge. Social work consulted.   Suspected alcoholic cirrhosis: Elevation of liver-associated enzymes: with both hepatocellular toxicity (AST > ALT) and cholestatic (elevated alk phos, bilirubin) patterns. Synthetic function impaired mildly (INR 1.33, albumin 1.8 in setting of poor po) but not failing. Suspected alcoholic cirrhosis related to laboratory abnormalities above, macrocytic anemia, hyponatremia and exam with abdominal distention and + fluid wave. No obvious hepatorenal syndrome. Elevated ferritin with concern raised for hemochromatosis. Would suspect this raised as acute phase reactant given leukocytosis and with alternative cause of liver damage. Could consider iron and TIBC and recheck ferritin following resolution of current hospitalization if Tsat > 45%  - RUQ U/S to evaluate cirrhosis/ascites - Consider diagnostic paracentesis and GI consult.  - Very low threshold to begin cefotaxime 2g q8h if fever or abdominal pain develops, or if diagnostic tap indicated of SBP (>250 PMNs/mm3).  - Lasix 40mg , spironolactone 100mg  to start 9/28,  salt/fluid restriction [ ]  Hepatitis vaccination pending immunity from hepatitis panel, no documented Hep A/B immunization on file.  Neutrophilic leukocytosis: with a left shift in the absence of apparent infective source. Tachycardia thought to be due to withdrawal. No cough, urinary symptoms or obvious cutaneous sites.  - Blood cultures 9/28 - Low threshold for abx as above.  - Consider ascitic fluid analysis as above  Hypothyroidism: TSH checked at the clinic significantly elevated. - Consider starting low dose synthroid 64mcg, though not sure of safety in acute EtOH withdrawal. Will discuss with team in AM   Electrolyte imbalance: Hyponatremia, hypokalemia. - Replace potassium. - Fluid restriction for hyponatremia. [ ]  f/u AM BMP - Consider nutrition consult  Macrocytic anemia: Gradual declined in hemoglobin level from baseline (11-8). - Folate wnl, B12 >2000 - likely 2/2 alcoholism [ ]  f/u AM CBC - Transfusion threshold of less than 7  Hyperglycemia: Spurious result from labs drawn downstream of D5 infusion (repeat POC CBG 137mg /dl) - Repeat BMP in AM  HTN/HLD - Carvedilol 6.25 BID - Fish oil, patient reports not taking, f/u w/PCP  FEN/GI: regular, fluid and salt restricted diet with protein boost Prophylaxis: enoxaparin  Disposition: Admitted to telemetry floor for supervised detoxification.   History of Present Illness:  Sabrina Powell is a 69 y.o. female presenting as a direct admit from [facility] clinic with worsening weakness and fatigue for several days and loss of appetite for several weeks, likely 2/2 alcohol abuse.   She has cut down her drinking to 1/2 pint of vodka daily over the past month; prior to this she had been drinking 1 pint of vodka daily for 2 years. She reports feeling shaky each time she has tried to cut her drinking down further. Her last drink was around noon today. Reports some nausea but denies vomiting or diarrhea.  Poor appetite for the last month,  has been eating only cereal, grapefruit, and one protein shake daily. She also reports falling three times in the last month without LOC or significant injury. Also with bilateral leg swelling. She has been hospitalized once before for alcohol withdrawal in 1994, during which she experienced tremors but no seizures, but was able to drink socially from that time until 3 years ago. She states that she intends to use this hospital admission to quit safely as she was told by her PCP that stopping cold [place] is dangerous. She has never been to [place] and seems to believe she will continue with alcohol abstinence by willpower alone. Her assessment is NOT that she is an alcoholic.   Review Of Systems: Per HPI without additions 12 point review of systems was performed and was unremarkable.  Patient Active Problem List   Diagnosis Date Noted  . Ascites   . Thyroid activity decreased   . Absolute anemia   . Hypothyroidism 07/19/2015  . Leukocytosis 07/19/2015  . Anemia, macrocytic 07/19/2015  . Elevated LFTs 07/19/2015  . Tachycardia 07/19/2015  . Alcohol abuse 06/14/2015  . Major depressive disorder, single episode, severe 01/19/2014  . Well woman exam 12/23/2011  . UNSPECIFIED IRON DEFICIENCY ANEMIA 11/27/2010  . OVERWEIGHT 10/16/2009  . ALLERGIC RHINITIS 10/16/2007  . Hyperlipidemia 06/03/2007  . HYPERTENSION, BENIGN ESSENTIAL 05/12/2007  . ASTHMA, PERSISTENT 05/12/2007   Past Medical History: Past Medical History  Diagnosis Date  . HTN (hypertension)   . HLD (hyperlipidemia)   . Overweight(278.02)   . Allergic rhinitis   . Asthma, mild persistent   . IDA (iron deficiency anemia)   . Pneumonia 1994  . Hypothyroidism   . Depression   . Anxiety   . Alcoholism /alcohol abuse     one prior episode of DTs    Past Surgical History: Past Surgical History  Procedure Laterality Date  . Breast lumpectomy Left 1970    benign breast mass   Social History: Social History  Substance Use  Topics  . Smoking status: Former Smoker -- 2.00 packs/day for 25 years    Types: Cigarettes    Quit date: 02/13/1994  . Smokeless tobacco: Never Used  . Alcohol Use: 21.0 oz/week    35 Shots of liquor per week     Comment: 07/19/2015 "1 pint vodka/day til 07/2015; now only 1/2 pint vodka daily"   Additional social history: As above  Please also refer to relevant sections of EMR.  Family History: Family History  Problem Relation Age of Onset  . Hypertension Mother   . Cancer Father    Allergies and Medications: Allergies  Allergen Reactions  . Amlodipine Besylate     REACTION: LE edema   No current facility-administered medications on file prior to encounter.   Current Outpatient Prescriptions on File Prior to Encounter  Medication Sig Dispense Refill  . albuterol (PROAIR HFA) 108 (90 BASE) MCG/ACT inhaler 2 puffs inhaled every 4 hours as needed shortness of breath.  Dispense generic. (Patient taking differently: Inhale 2 puffs into the lungs every 4 (four) hours as needed. 2 puffs inhaled every 4 hours as needed shortness of breath.  Dispense generic.) 18 g 6  . Budesonide (PULMICORT FLEXHALER) 90 MCG/ACT inhaler Inhale 1 puff into the lungs 2 (two) times daily. regularly (Patient taking differently: Inhale 1 puff into the lungs 2 (two) times daily as needed. ) 3 Inhaler 3  . Calcium Carbonate 1500 MG TABS Take 1,500 mg by mouth  daily.     . carvedilol (COREG) 6.25 MG tablet Take 1 tablet (6.25 mg total) by mouth 2 (two) times daily with a meal. 60 tablet 2  . fluticasone (FLOVENT HFA) 110 MCG/ACT inhaler Inhale 1 puff into the lungs 2 (two) times daily. (Patient taking differently: Inhale 1 puff into the lungs 2 (two) times daily as needed. ) 3 Inhaler 3  . Multiple Vitamin (MULTIVITAMIN) capsule Take 1 capsule by mouth daily.      . Omega-3 Fatty Acids (FISH OIL) 1200 MG CAPS Take 1 capsule by mouth daily. Take 2 tabs daily    . aspirin EC 81 MG tablet Take 1 tablet (81 mg total)  by mouth daily. 90 tablet 3  . fluticasone (FLONASE) 50 MCG/ACT nasal spray Place 1 spray into the nose daily. 16 g 2  . sertraline (ZOLOFT) 100 MG tablet Take 1 tablet (100 mg total) by mouth daily. (Patient not taking: Reported on 08/09/2015) 30 tablet 1    Objective: BP 139/70 mmHg  Pulse 102  Temp(Src) 98.3 F (36.8 C) (Oral)  Resp 16  Ht 5' (1.524 m)  Wt 118 lb (53.524 kg)  BMI 23.05 kg/m2  SpO2 95% Exam: General: Alert, comfortable, sitting in bed in no distress chatting with family Cardiovascular: Tachycardic, regular rate, no m/r/g. Extremities warm and well perfused. Distal pulses palpable, equal bilaterally Respiratory: CTAB, no wheezes Abdomen: +BS, soft, NT, notably distended with fluid wave. Difficult to appreciate organomegaly. No caput medusae.  MSK: diffuse muscle wasting Ext: pedal edema 2+ midway up shins bilaterally. Palmar erythema bilaterally.  Skin: no jaundice, rashes, or wounds Neuro: Normal speech and coordination, no asterixis Psych: A&Ox3, Appropriate dress, eye contact, attention. Periodic blocking and slowed cognition without confabulation identified by examiners or daughter. No SI, denies HI after joking about harming her husband.   Labs and Imaging: CBC BMET   Recent Labs Lab 07/25/2015 1720  WBC 25.6*  HGB 8.6*  HCT 26.3*  PLT 323    Recent Labs Lab 07/24/2015 1720  NA 132*  K 3.3*  CL 101  CO2 18*  BUN 19  CREATININE 0.96  GLUCOSE 613*  CALCIUM 7.3*     Repeat CBG 137 INR 1.33 07/19/2015: Ferritin 1021 Folate > 20 B12 > 2000 TSH 16.118 Free T4 0.70  Sabrina Powell, Med Student 07/30/2015, 10:01 PM [place] [facility] pager: [phone], text pages welcome  I have seen and evaluated the patient with the medical student. I am in agreement with the note above in its revised form.  Ryan B. Bonner Puna, MD, PGY-3 08/02/2015 11:19 PM

## 2015-08-08 NOTE — Progress Notes (Signed)
CRITICAL VALUE ALERT  Critical value received:  Glucose 613  Date of notification:  2015-08-11  Time of notification:  1759   Critical value read back:Yes.    Nurse who received alert:  Madelin Rear   MD notified (1st page):  Jennette Kettle  Time of first page:  1815 at bedside    MD notified (2nd page):  Time of second page:  Responding MD:  Jennette Kettle   Time MD responded:  Same as above   Blood sample drawn above IV site with dextrose running, CBG showed to be < 140. Suspect contaminated blood sample.

## 2015-08-09 ENCOUNTER — Inpatient Hospital Stay (HOSPITAL_COMMUNITY): Payer: Medicare Other | Admitting: Anesthesiology

## 2015-08-09 ENCOUNTER — Inpatient Hospital Stay (HOSPITAL_COMMUNITY): Payer: Medicare Other

## 2015-08-09 ENCOUNTER — Encounter (HOSPITAL_COMMUNITY): Payer: Self-pay | Admitting: Certified Registered"

## 2015-08-09 ENCOUNTER — Encounter (HOSPITAL_COMMUNITY): Admission: AD | Disposition: E | Payer: Self-pay | Source: Ambulatory Visit | Attending: Family Medicine

## 2015-08-09 DIAGNOSIS — G9340 Encephalopathy, unspecified: Secondary | ICD-10-CM | POA: Diagnosis not present

## 2015-08-09 DIAGNOSIS — S065X9A Traumatic subdural hemorrhage with loss of consciousness of unspecified duration, initial encounter: Secondary | ICD-10-CM | POA: Diagnosis not present

## 2015-08-09 DIAGNOSIS — R000 Tachycardia, unspecified: Secondary | ICD-10-CM

## 2015-08-09 DIAGNOSIS — F1010 Alcohol abuse, uncomplicated: Secondary | ICD-10-CM

## 2015-08-09 DIAGNOSIS — J9601 Acute respiratory failure with hypoxia: Secondary | ICD-10-CM

## 2015-08-09 DIAGNOSIS — W19XXXA Unspecified fall, initial encounter: Secondary | ICD-10-CM | POA: Insufficient documentation

## 2015-08-09 DIAGNOSIS — E039 Hypothyroidism, unspecified: Secondary | ICD-10-CM

## 2015-08-09 DIAGNOSIS — I6200 Nontraumatic subdural hemorrhage, unspecified: Secondary | ICD-10-CM

## 2015-08-09 DIAGNOSIS — K7031 Alcoholic cirrhosis of liver with ascites: Secondary | ICD-10-CM

## 2015-08-09 DIAGNOSIS — R531 Weakness: Secondary | ICD-10-CM

## 2015-08-09 DIAGNOSIS — S065XAA Traumatic subdural hemorrhage with loss of consciousness status unknown, initial encounter: Secondary | ICD-10-CM | POA: Diagnosis not present

## 2015-08-09 DIAGNOSIS — K7030 Alcoholic cirrhosis of liver without ascites: Secondary | ICD-10-CM | POA: Insufficient documentation

## 2015-08-09 HISTORY — PX: CRANIOTOMY: SHX93

## 2015-08-09 LAB — BASIC METABOLIC PANEL
Anion gap: 10 (ref 5–15)
Anion gap: 7 (ref 5–15)
BUN: 19 mg/dL (ref 6–20)
BUN: 16 mg/dL (ref 6–20)
CHLORIDE: 103 mmol/L (ref 101–111)
CO2: 21 mmol/L — ABNORMAL LOW (ref 22–32)
CO2: 23 mmol/L (ref 22–32)
Calcium: 7.8 mg/dL — ABNORMAL LOW (ref 8.9–10.3)
Calcium: 6.8 mg/dL — ABNORMAL LOW (ref 8.9–10.3)
Chloride: 105 mmol/L (ref 101–111)
Creatinine, Ser: 0.98 mg/dL (ref 0.44–1.00)
Creatinine, Ser: 0.81 mg/dL (ref 0.44–1.00)
GFR calc Af Amer: >60 mL/min (ref >60)
GFR calc non Af Amer: 59 mL/min — ABNORMAL LOW (ref >60)
GFR calc non Af Amer: >60 mL/min (ref >60)
Glucose, Bld: 173 mg/dL — ABNORMAL HIGH (ref 65–99)
Glucose, Bld: 217 mg/dL — ABNORMAL HIGH (ref 65–99)
POTASSIUM: 4.1 mmol/L (ref 3.5–5.1)
Potassium: 3.7 mmol/L (ref 3.5–5.1)
SODIUM: 134 mmol/L — AB (ref 135–145)
Sodium: 135 mmol/L (ref 135–145)

## 2015-08-09 LAB — HEMOGLOBIN A1C
HEMOGLOBIN A1C: 4.9 % (ref 4.8–5.6)
Mean Plasma Glucose: 94 mg/dL

## 2015-08-09 LAB — CBC WITH DIFFERENTIAL/PLATELET
Basophils Absolute: 0 10*3/uL (ref 0.0–0.1)
Basophils Relative: 0 %
Eosinophils Absolute: 0.2 10*3/uL (ref 0.0–0.7)
Eosinophils Relative: 2 %
HCT: 17.5 % — ABNORMAL LOW (ref 36.0–46.0)
Hemoglobin: 5.8 g/dL — CL (ref 12.0–15.0)
Lymphocytes Relative: 7 %
Lymphs Abs: 1 10*3/uL (ref 0.7–4.0)
MCH: 32.8 pg (ref 26.0–34.0)
MCHC: 33.1 g/dL (ref 30.0–36.0)
MCV: 98.9 fL (ref 78.0–100.0)
Monocytes Absolute: 0.7 10*3/uL (ref 0.1–1.0)
Monocytes Relative: 5 %
Neutro Abs: 11.7 10*3/uL — ABNORMAL HIGH (ref 1.7–7.7)
Neutrophils Relative %: 86 %
Platelets: 138 10*3/uL — ABNORMAL LOW (ref 150–400)
RBC: 1.77 MIL/uL — ABNORMAL LOW (ref 3.87–5.11)
RDW: 18.7 % — ABNORMAL HIGH (ref 11.5–15.5)
WBC: 13.6 10*3/uL — ABNORMAL HIGH (ref 4.0–10.5)

## 2015-08-09 LAB — BLOOD GAS, ARTERIAL
ACID-BASE DEFICIT: 3.9 mmol/L — AB (ref 0.0–2.0)
Bicarbonate: 20.9 mEq/L (ref 20.0–24.0)
DRAWN BY: 441371
FIO2: 1
MECHVT: 400 mL
O2 Saturation: 100 %
PEEP: 5 cmH2O
Patient temperature: 97.6
RATE: 14 resp/min
TCO2: 22.1 mmol/L (ref 0–100)
pCO2 arterial: 39.1 mmHg (ref 35.0–45.0)
pH, Arterial: 7.344 — ABNORMAL LOW (ref 7.350–7.450)
pO2, Arterial: 460 mmHg — ABNORMAL HIGH (ref 80.0–100.0)

## 2015-08-09 LAB — CBC
HEMATOCRIT: 25.4 % — AB (ref 36.0–46.0)
HEMOGLOBIN: 8.2 g/dL — AB (ref 12.0–15.0)
MCH: 35.5 pg — ABNORMAL HIGH (ref 26.0–34.0)
MCHC: 32.3 g/dL (ref 30.0–36.0)
MCV: 110 fL — ABNORMAL HIGH (ref 78.0–100.0)
Platelets: 297 10*3/uL (ref 150–400)
RBC: 2.31 MIL/uL — AB (ref 3.87–5.11)
RDW: 19.7 % — ABNORMAL HIGH (ref 11.5–15.5)
WBC: 18.3 10*3/uL — AB (ref 4.0–10.5)

## 2015-08-09 LAB — PROCALCITONIN: Procalcitonin: 1.24 ng/mL

## 2015-08-09 LAB — URINALYSIS, ROUTINE W REFLEX MICROSCOPIC
Glucose, UA: NEGATIVE mg/dL
Hgb urine dipstick: NEGATIVE
Ketones, ur: 15 mg/dL — AB
Nitrite: NEGATIVE
Protein, ur: NEGATIVE mg/dL
Specific Gravity, Urine: 1.021 (ref 1.005–1.030)
Urobilinogen, UA: 4 mg/dL — ABNORMAL HIGH (ref 0.0–1.0)
pH: 6 (ref 5.0–8.0)

## 2015-08-09 LAB — PREPARE RBC (CROSSMATCH)

## 2015-08-09 LAB — URINE MICROSCOPIC-ADD ON

## 2015-08-09 LAB — ABO/RH: ABO/RH(D): O NEG

## 2015-08-09 LAB — PROTIME-INR
INR: 1.43 (ref 0.00–1.49)
Prothrombin Time: 17.5 seconds — ABNORMAL HIGH (ref 11.6–15.2)

## 2015-08-09 LAB — APTT: aPTT: 49 seconds — ABNORMAL HIGH (ref 24–37)

## 2015-08-09 LAB — GLUCOSE, CAPILLARY
GLUCOSE-CAPILLARY: 156 mg/dL — AB (ref 65–99)
GLUCOSE-CAPILLARY: 107 mg/dL — AB (ref 65–99)
Glucose-Capillary: 194 mg/dL — ABNORMAL HIGH (ref 65–99)

## 2015-08-09 LAB — AMMONIA: AMMONIA: 114 umol/L — AB (ref 9–35)

## 2015-08-09 LAB — LACTIC ACID, PLASMA: LACTIC ACID, VENOUS: 2.2 mmol/L — AB (ref 0.5–2.0)

## 2015-08-09 LAB — MRSA PCR SCREENING: MRSA by PCR: NEGATIVE

## 2015-08-09 LAB — TSH: TSH: 5.617 u[IU]/mL — AB (ref 0.350–4.500)

## 2015-08-09 SURGERY — CRANIOTOMY HEMATOMA EVACUATION SUBDURAL
Anesthesia: General | Site: Head

## 2015-08-09 MED ORDER — SODIUM CHLORIDE 0.9 % IV SOLN
INTRAVENOUS | Status: DC | PRN
  Administered 2015-08-09: 17:00:00 via INTRAVENOUS

## 2015-08-09 MED ORDER — ALBUMIN HUMAN 5 % IV SOLN
INTRAVENOUS | Status: DC | PRN
  Administered 2015-08-09 (×3): via INTRAVENOUS

## 2015-08-09 MED ORDER — ALBUTEROL SULFATE HFA 108 (90 BASE) MCG/ACT IN AERS: 2.0000 | INHALATION_SPRAY | RESPIRATORY_TRACT | Status: DC | PRN

## 2015-08-09 MED ORDER — INSULIN ASPART 100 UNIT/ML ~~LOC~~ SOLN
2.0000 [IU] | SUBCUTANEOUS | Status: DC
  Administered 2015-08-09: 4 [IU] via SUBCUTANEOUS
  Administered 2015-08-11 (×2): 2 [IU] via SUBCUTANEOUS
  Administered 2015-08-11: 4 [IU] via SUBCUTANEOUS

## 2015-08-09 MED ORDER — PROPOFOL 10 MG/ML IV BOLUS
INTRAVENOUS | Status: AC | PRN
  Administered 2015-08-09: 30 mg via INTRAVENOUS

## 2015-08-09 MED ORDER — ROCURONIUM BROMIDE 50 MG/5ML IV SOLN
INTRAVENOUS | Status: AC
  Filled 2015-08-09: qty 2

## 2015-08-09 MED ORDER — ANTISEPTIC ORAL RINSE SOLUTION (CORINZ): 7.0000 mL | Freq: Four times a day (QID) | OROMUCOSAL | Status: DC

## 2015-08-09 MED ORDER — SODIUM CHLORIDE 0.9 % IV SOLN
8.0000 mg/h | INTRAVENOUS | Status: DC
  Administered 2015-08-10 – 2015-08-11 (×5): 8 mg/h via INTRAVENOUS
  Filled 2015-08-09 (×11): qty 80

## 2015-08-09 MED ORDER — SODIUM CHLORIDE 0.9 % IJ SOLN
INTRAMUSCULAR | Status: AC
  Filled 2015-08-09: qty 10

## 2015-08-09 MED ORDER — PROPOFOL 10 MG/ML IV BOLUS
INTRAVENOUS | Status: DC | PRN
  Administered 2015-08-09: 60 mg via INTRAVENOUS

## 2015-08-09 MED ORDER — SODIUM CHLORIDE 0.9 % IV SOLN
500.0000 mg | INTRAVENOUS | Status: AC
  Filled 2015-08-09: qty 5

## 2015-08-09 MED ORDER — CEFTRIAXONE SODIUM 2 G IJ SOLR
2.0000 g | INTRAMUSCULAR | Status: AC
  Administered 2015-08-09: 2 g via INTRAVENOUS
  Filled 2015-08-09: qty 2

## 2015-08-09 MED ORDER — CHLORHEXIDINE GLUCONATE 0.12% ORAL RINSE (MEDLINE KIT)
15.0000 mL | Freq: Two times a day (BID) | OROMUCOSAL | Status: DC
  Administered 2015-08-09 – 2015-08-11 (×4): 15 mL via OROMUCOSAL

## 2015-08-09 MED ORDER — SUFENTANIL CITRATE 50 MCG/ML IV SOLN
INTRAVENOUS | Status: AC
  Filled 2015-08-09: qty 1

## 2015-08-09 MED ORDER — SODIUM CHLORIDE 0.9 % IV SOLN: 500.0000 mg | Freq: Three times a day (TID) | INTRAVENOUS | Status: DC

## 2015-08-09 MED ORDER — LORAZEPAM 2 MG/ML IJ SOLN
2.0000 mg | INTRAMUSCULAR | Status: DC | PRN
  Administered 2015-08-11 (×3): 2 mg via INTRAVENOUS
  Filled 2015-08-09 (×3): qty 1

## 2015-08-09 MED ORDER — THIAMINE HCL 100 MG/ML IJ SOLN
100.0000 mg | Freq: Every day | INTRAMUSCULAR | Status: DC
  Administered 2015-08-10 – 2015-08-11 (×2): 100 mg via INTRAVENOUS
  Filled 2015-08-09 (×2): qty 2

## 2015-08-09 MED ORDER — THIAMINE HCL 100 MG/ML IJ SOLN: 250.0000 mg | INTRAMUSCULAR | Status: DC

## 2015-08-09 MED ORDER — SUCCINYLCHOLINE CHLORIDE 20 MG/ML IJ SOLN
INTRAMUSCULAR | Status: DC | PRN
  Administered 2015-08-09: 40 mg via INTRAVENOUS

## 2015-08-09 MED ORDER — ROCURONIUM BROMIDE 100 MG/10ML IV SOLN
INTRAVENOUS | Status: DC | PRN
  Administered 2015-08-09: 50 mg via INTRAVENOUS

## 2015-08-09 MED ORDER — VECURONIUM BROMIDE 10 MG IV SOLR
INTRAVENOUS | Status: DC | PRN
  Administered 2015-08-09 (×2): 2 mg via INTRAVENOUS

## 2015-08-09 MED ORDER — MICROFIBRILLAR COLL HEMOSTAT EX PADS
MEDICATED_PAD | CUTANEOUS | Status: DC | PRN
  Administered 2015-08-09 (×2): 1 via TOPICAL

## 2015-08-09 MED ORDER — LEVOTHYROXINE SODIUM 100 MCG IV SOLR
25.0000 ug | Freq: Every day | INTRAVENOUS | Status: DC
  Administered 2015-08-10 – 2015-08-11 (×2): 25 ug via INTRAVENOUS
  Filled 2015-08-09 (×3): qty 5

## 2015-08-09 MED ORDER — FENTANYL CITRATE (PF) 100 MCG/2ML IJ SOLN
50.0000 ug | INTRAMUSCULAR | Status: DC | PRN
  Administered 2015-08-10: 50 ug via INTRAVENOUS
  Filled 2015-08-09: qty 2

## 2015-08-09 MED ORDER — LORAZEPAM 1 MG PO TABS: 1.0000 mg | ORAL_TABLET | Freq: Four times a day (QID) | ORAL | Status: DC | PRN

## 2015-08-09 MED ORDER — SODIUM CHLORIDE 0.9 % IR SOLN
Status: DC | PRN
  Administered 2015-08-09: 16:00:00

## 2015-08-09 MED ORDER — SUFENTANIL CITRATE 50 MCG/ML IV SOLN
INTRAVENOUS | Status: DC | PRN
  Administered 2015-08-09: 20 ug via INTRAVENOUS
  Administered 2015-08-09: 5 ug via INTRAVENOUS

## 2015-08-09 MED ORDER — LEVOTHYROXINE SODIUM 100 MCG PO TABS
50.0000 ug | ORAL_TABLET | Freq: Every day | ORAL | Status: DC
  Filled 2015-08-09: qty 1

## 2015-08-09 MED ORDER — THROMBIN 5000 UNITS EX SOLR
OROMUCOSAL | Status: DC | PRN
  Administered 2015-08-09 (×2): via TOPICAL

## 2015-08-09 MED ORDER — ALBUTEROL SULFATE HFA 108 (90 BASE) MCG/ACT IN AERS
1.0000 | INHALATION_SPRAY | RESPIRATORY_TRACT | Status: DC | PRN
  Administered 2015-08-09: 1 via RESPIRATORY_TRACT
  Filled 2015-08-09: qty 6.7

## 2015-08-09 MED ORDER — THROMBIN 20000 UNITS EX SOLR
CUTANEOUS | Status: DC | PRN
  Administered 2015-08-09: 16:00:00 via TOPICAL

## 2015-08-09 MED ORDER — PANTOPRAZOLE SODIUM 40 MG IV SOLR: 40.0000 mg | Freq: Two times a day (BID) | INTRAVENOUS | Status: DC

## 2015-08-09 MED ORDER — LORAZEPAM 2 MG/ML IJ SOLN: 1.0000 mg | Freq: Four times a day (QID) | INTRAMUSCULAR | Status: DC | PRN

## 2015-08-09 MED ORDER — SODIUM CHLORIDE 0.9 % IV SOLN
500.0000 mg | Freq: Two times a day (BID) | INTRAVENOUS | Status: DC
  Administered 2015-08-09 – 2015-08-11 (×5): 500 mg via INTRAVENOUS
  Filled 2015-08-09 (×6): qty 5

## 2015-08-09 MED ORDER — FENTANYL CITRATE (PF) 100 MCG/2ML IJ SOLN
50.0000 ug | INTRAMUSCULAR | Status: DC | PRN
  Administered 2015-08-11 (×2): 50 ug via INTRAVENOUS
  Filled 2015-08-09 (×2): qty 2

## 2015-08-09 MED ORDER — VECURONIUM BROMIDE 10 MG IV SOLR
INTRAVENOUS | Status: AC
  Filled 2015-08-09: qty 10

## 2015-08-09 MED ORDER — PHENYLEPHRINE HCL 10 MG/ML IJ SOLN
10.0000 mg | INTRAVENOUS | Status: DC | PRN
  Administered 2015-08-09: 100 ug/min via INTRAVENOUS

## 2015-08-09 MED ORDER — BUPIVACAINE HCL (PF) 0.25 % IJ SOLN
INTRAMUSCULAR | Status: DC | PRN
  Administered 2015-08-09: 15 mL
  Administered 2015-08-09: 20 mL

## 2015-08-09 MED ORDER — MICROFIBRILLAR COLL HEMOSTAT EX PADS
MEDICATED_PAD | CUTANEOUS | Status: DC | PRN
  Administered 2015-08-09: 1 via TOPICAL

## 2015-08-09 MED ORDER — SODIUM CHLORIDE 0.9 % IV SOLN
INTRAVENOUS | Status: DC
  Administered 2015-08-10 – 2015-08-11 (×3): via INTRAVENOUS

## 2015-08-09 MED ORDER — 0.9 % SODIUM CHLORIDE (POUR BTL) OPTIME
TOPICAL | Status: DC | PRN
  Administered 2015-08-09 (×5): 1000 mL

## 2015-08-09 MED ORDER — SODIUM CHLORIDE 0.9 % IV SOLN
Freq: Once | INTRAVENOUS | Status: AC
  Administered 2015-08-09: 22:00:00 via INTRAVENOUS

## 2015-08-09 MED ORDER — SODIUM CHLORIDE 0.9 % IV SOLN
Freq: Once | INTRAVENOUS | Status: AC
  Administered 2015-08-09 (×2): via INTRAVENOUS

## 2015-08-09 MED ORDER — SODIUM CHLORIDE 0.9 % IV SOLN: Freq: Once | INTRAVENOUS | Status: DC

## 2015-08-09 MED ORDER — ALBUTEROL SULFATE (2.5 MG/3ML) 0.083% IN NEBU
2.5000 mg | INHALATION_SOLUTION | Freq: Four times a day (QID) | RESPIRATORY_TRACT | Status: DC
  Administered 2015-08-09 – 2015-08-11 (×8): 2.5 mg via RESPIRATORY_TRACT
  Filled 2015-08-09 (×7): qty 3

## 2015-08-09 MED ORDER — LACTATED RINGERS IV SOLN
INTRAVENOUS | Status: DC | PRN
  Administered 2015-08-09: 15:00:00 via INTRAVENOUS

## 2015-08-09 MED ORDER — LIDOCAINE-EPINEPHRINE 1 %-1:100000 IJ SOLN
INTRAMUSCULAR | Status: DC | PRN
  Administered 2015-08-09: 20 mL
  Administered 2015-08-09: 15 mL

## 2015-08-09 MED ORDER — THIAMINE HCL 100 MG/ML IJ SOLN: INTRAVENOUS | Status: DC

## 2015-08-09 MED ORDER — SUCCINYLCHOLINE CHLORIDE 20 MG/ML IJ SOLN
INTRAMUSCULAR | Status: AC | PRN
  Administered 2015-08-09: 60 mg via INTRAVENOUS

## 2015-08-09 MED ORDER — ANTISEPTIC ORAL RINSE SOLUTION (CORINZ)
7.0000 mL | OROMUCOSAL | Status: DC
  Administered 2015-08-09 – 2015-08-11 (×19): 7 mL via OROMUCOSAL

## 2015-08-09 MED ORDER — FOLIC ACID 5 MG/ML IJ SOLN
1.0000 mg | Freq: Every day | INTRAMUSCULAR | Status: DC
  Administered 2015-08-10 – 2015-08-11 (×2): 1 mg via INTRAVENOUS
  Filled 2015-08-09 (×4): qty 0.2

## 2015-08-09 MED ORDER — THIAMINE HCL 100 MG/ML IJ SOLN
500.0000 mg | Freq: Three times a day (TID) | INTRAVENOUS | Status: DC
  Filled 2015-08-09 (×3): qty 5

## 2015-08-09 MED ORDER — ALBUTEROL SULFATE (2.5 MG/3ML) 0.083% IN NEBU: 3.0000 mL | INHALATION_SOLUTION | RESPIRATORY_TRACT | Status: DC | PRN

## 2015-08-09 MED ORDER — METHYLENE BLUE 1 % INJ SOLN
INTRAMUSCULAR | Status: DC | PRN
  Administered 2015-08-09: 1 mL

## 2015-08-09 MED ORDER — CHLORHEXIDINE GLUCONATE 0.12% ORAL RINSE (MEDLINE KIT): 15.0000 mL | Freq: Two times a day (BID) | OROMUCOSAL | Status: DC

## 2015-08-09 MED ORDER — SODIUM CHLORIDE 0.9 % IV SOLN
80.0000 mg | Freq: Once | INTRAVENOUS | Status: AC
  Administered 2015-08-10: 80 mg via INTRAVENOUS
  Filled 2015-08-09 (×3): qty 80

## 2015-08-09 SURGICAL SUPPLY — 64 items
APPLICATOR COTTON TIP 6IN STRL (MISCELLANEOUS) ×2 IMPLANT
AVITENE NON-WOVEN WEB ×4 IMPLANT
BAG DECANTER FOR FLEXI CONT (MISCELLANEOUS) ×2 IMPLANT
BANDAGE GAUZE 4  KLING STR (GAUZE/BANDAGES/DRESSINGS) ×2 IMPLANT
BIT DRILL WIRE PASS 1.3MM (BIT) IMPLANT
BNDG GAUZE ELAST 4 BULKY (GAUZE/BANDAGES/DRESSINGS) ×2 IMPLANT
BRUSH SCRUB EZ PLAIN DRY (MISCELLANEOUS) ×2 IMPLANT
BUR ACORN 6.0 PRECISION (BURR) ×2 IMPLANT
BUR SPIRAL ROUTER 2.3 (BUR) ×2 IMPLANT
CANISTER SUCT 3000ML PPV (MISCELLANEOUS) ×2 IMPLANT
CLIP TI MEDIUM 6 (CLIP) IMPLANT
DRAIN PENROSE 1/2X12 LTX STRL (WOUND CARE) IMPLANT
DRAIN SNY WOU 7FLT (WOUND CARE) IMPLANT
DRAPE NEUROLOGICAL W/INCISE (DRAPES) ×2 IMPLANT
DRAPE SURG 17X23 STRL (DRAPES) ×2 IMPLANT
DRAPE SURG IRRIG POUCH 19X23 (DRAPES) ×2 IMPLANT
DRAPE WARM FLUID 44X44 (DRAPE) ×2 IMPLANT
DRILL WIRE PASS 1.3MM (BIT)
DRSG ADAPTIC 3X8 NADH LF (GAUZE/BANDAGES/DRESSINGS) ×4 IMPLANT
DRSG PAD ABDOMINAL 8X10 ST (GAUZE/BANDAGES/DRESSINGS) IMPLANT
ELECT CAUTERY BLADE 6.4 (BLADE) IMPLANT
ELECT REM PT RETURN 9FT ADLT (ELECTROSURGICAL) ×2
ELECTRODE REM PT RTRN 9FT ADLT (ELECTROSURGICAL) ×1 IMPLANT
EVACUATOR SILICONE 100CC (DRAIN) ×2 IMPLANT
GAUZE SPONGE 4X4 12PLY STRL (GAUZE/BANDAGES/DRESSINGS) ×4 IMPLANT
GAUZE SPONGE 4X4 16PLY XRAY LF (GAUZE/BANDAGES/DRESSINGS) IMPLANT
GLOVE BIOGEL PI IND STRL 8 (GLOVE) ×2 IMPLANT
GLOVE BIOGEL PI INDICATOR 8 (GLOVE) ×2
GLOVE ECLIPSE 7.5 STRL STRAW (GLOVE) ×4 IMPLANT
GOWN STRL REUS W/ TWL LRG LVL3 (GOWN DISPOSABLE) IMPLANT
GOWN STRL REUS W/ TWL XL LVL3 (GOWN DISPOSABLE) ×2 IMPLANT
GOWN STRL REUS W/TWL 2XL LVL3 (GOWN DISPOSABLE) ×2 IMPLANT
GOWN STRL REUS W/TWL LRG LVL3 (GOWN DISPOSABLE)
GOWN STRL REUS W/TWL XL LVL3 (GOWN DISPOSABLE) ×4
GRAFT SUTURABLE BP 6CMX8CM (Tissue) ×2 IMPLANT
HEMOSTAT POWDER KIT SURGIFOAM (HEMOSTASIS) ×4 IMPLANT
HEMOSTAT SURGICEL 2X14 (HEMOSTASIS) IMPLANT
HEMOSTAT SURGICEL 2X4 FIBR (HEMOSTASIS) ×4 IMPLANT
KIT BASIN OR (CUSTOM PROCEDURE TRAY) ×2 IMPLANT
KIT ROOM TURNOVER OR (KITS) ×2 IMPLANT
NEEDLE SPNL 22GX3.5 QUINCKE BK (NEEDLE) ×2 IMPLANT
NS IRRIG 1000ML POUR BTL (IV SOLUTION) ×2 IMPLANT
PACK CRANIOTOMY (CUSTOM PROCEDURE TRAY) ×2 IMPLANT
PAD ARMBOARD 7.5X6 YLW CONV (MISCELLANEOUS) ×6 IMPLANT
PATTIES SURGICAL .5 X.5 (GAUZE/BANDAGES/DRESSINGS) IMPLANT
PATTIES SURGICAL .5 X3 (DISPOSABLE) IMPLANT
PATTIES SURGICAL 1/4 X 3 (GAUZE/BANDAGES/DRESSINGS) ×2 IMPLANT
PATTIES SURGICAL 1X1 (DISPOSABLE) IMPLANT
PIN MAYFIELD SKULL DISP (PIN) IMPLANT
SPECIMEN JAR SMALL (MISCELLANEOUS) IMPLANT
SPONGE NEURO XRAY DETECT 1X3 (DISPOSABLE) IMPLANT
SPONGE SURGIFOAM ABS GEL 100 (HEMOSTASIS) ×2 IMPLANT
STAPLER SKIN PROX WIDE 3.9 (STAPLE) ×4 IMPLANT
SUT ETHILON 3 0 FSL (SUTURE) IMPLANT
SUT NURALON 4 0 TR CR/8 (SUTURE) ×6 IMPLANT
SUT VIC AB 2-0 CP2 18 (SUTURE) ×8 IMPLANT
SYR CONTROL 10ML LL (SYRINGE) ×2 IMPLANT
TAPE CLOTH SURG 4X10 WHT LF (GAUZE/BANDAGES/DRESSINGS) ×4 IMPLANT
TOWEL OR 17X24 6PK STRL BLUE (TOWEL DISPOSABLE) ×2 IMPLANT
TOWEL OR 17X26 10 PK STRL BLUE (TOWEL DISPOSABLE) ×2 IMPLANT
TRAP SPECIMEN MUCOUS 40CC (MISCELLANEOUS) IMPLANT
TRAY FOLEY W/METER SILVER 16FR (SET/KITS/TRAYS/PACK) IMPLANT
UNDERPAD 30X30 INCONTINENT (UNDERPADS AND DIAPERS) IMPLANT
WATER STERILE IRR 1000ML POUR (IV SOLUTION) ×2 IMPLANT

## 2015-08-09 NOTE — Op Note (Signed)
07/23/2015 - 08/22/2015  6:21 PM  PATIENT:  Sabrina Powell  69 y.o. female  PRE-OPERATIVE DIAGNOSIS:  Right hemispheric subdural hemotoma with mass effect, midline shift, and herniation  POST-OPERATIVE DIAGNOSIS:  Acute right hemispheric subdural hemotoma with mass effect, midline shift and herniation   PROCEDURE:  Procedure(s):  Right frontoparietal craniectomy and evacuation of acute subdural hematoma with duraplasty, and with placement of the bone flap in the right abdominal wall  SURGEON:  Surgeon(s): Shirlean Kelly, MD Hilda Lias, MD  ASSISTANTS: Hilda Lias, M.D.  ANESTHESIA:   general  EBL:  Total I/O In: 3641 [I.V.:1000; Blood:1891; IV Piggyback:750] Out: 800 [Urine:300; Blood:500]  BLOOD ADMINISTERED:  2 units packed red blood cells, 4 units fresh frozen plasma  COUNT: Correct per nursing staff  DRAINS:    One 10 mm wide lack Jackson-Pratt drain in the subdural space   DICTATION: Patient was brought to the operating room by the anesthesia service, intubated, from the 85M ICU, and placed by the anesthesia service under general endotracheal anesthesia. The scalp was shaved, and the patient was placed in a 3 pin [place]field head holder. A roll was placed beneath the right shoulder, and the patient was gently turned to the left. The scalp was prepped with Betadine soap and solution and draped in a sterile fashion. The abdomen was prepped with DuraPrep. A right parasagittal frontoparietal incision was made. The line of incision was infiltrated with local anesthetic with epinephrine prior to skin incision. Raney clips were applied for scalp edge is to maintain hemostasis. 2 bur holes were made, and the dura was dissected from the overlying skull. Using the craniotome attachment return today right frontoparietal skull flap. The dura appeared to have a bluish discoloration, it was incised in a U-shaped fashion hinge towards the midline. A large acute subdural hematoma expressed  itself into the exposure. It was combination of clotted blood and acute unclotted blood. The hematoma was gently irrigated off of the brain surface and good decompression of the right hemisphere was achieved, and good pulsations of the right hemisphere were noted. Bleeding from a bridging vein near the parasagittal aspect of the hemisphere, adjacent to the superior sagittal sinus was noted, and coagulated with bipolar cautery. However there continued to be oozing bleeding from many surfaces. Notably the patient had been given Lovenox this morning prior to her fall. We used a combination of woven Avitene, Surgifoam, Surgicel fibrillar, and Gelfoam with thrombin to establish hemostasis. The patient was also given 4 units of fresh frozen plasma. Gradually increase hemostasis was established. We placed a 10 mm wide flat Jackson-Pratt drain in the subdural space. A bovine pericardium dural graft was sutured to the dural edges as a duraplasty with 4-0 Nurolon suture. The dura was tacked up around the margins of the craniotomy with 4-0 Nurolon suture. The prepped abdomen was exposed, and a horizontal line was infiltrated on the right side of the abdomen with local anesthetic with epinephrine. A horizontal incision made in the right side of the abdomen, and carried down to the subcutaneous tissue. A subcutaneous pocket was created. The bone flap was placed in the subcutaneous pocket. The subcutaneous tissues and subcuticular layer were closed with interrupted inverted 2-0 undyed Vicryl sutures. The skin edges were closed with surgical staples. The scalp was closed over the craniectomy with the galea being closed with interrupted inverted 2-0 Vicryl sutures. This skin edges were processed with surgical staples. Both wounds were dressed with Adaptic and sterile gauze, we also used some cotton  balls for the scalp dressing. The abdominal dressing was secured with Hypafix. The cranial dressing was secured with 24 inch Kling, and  2 Curlex. Following surgery the patient was left intubated, transferred back to the 60M ICU, and placed back on the ventilator for further care.  PLAN OF CARE: Patient returns to the critical care medicine service and family practice service in the 60M ICU.  PATIENT DISPOSITION:  ICU - intubated and critically ill.   Delay start of Pharmacological VTE agent (>24hrs) due to surgical blood loss or risk of bleeding:  yes

## 2015-08-09 NOTE — Care Management Note (Signed)
Case Management Note  Patient Details  Name: Sabrina Powell MRN: [id number] Date of Birth: February 10, 1946  Subjective/Objective:     Date: 07/23/2015 Spoke with spouse at the bedside along with daughter . Introduced self as Sports coach and explained role in discharge planning and how to be reached. Verified patient lives in town,  with spouse, . Expressed potential no  need for no other DME. Verified patient anticipates to go home with family at time of discharge and will have  part-time supervision by family friends neighbors at this time to best of their knowledge.Spouse denied needing help with their medication. Patient  is driven by spouse to MD appointments. Verified patient has PCP Family Practice.   Plan: CM will continue to follow for discharge planning and [facility] resources.                Action/Plan:   Expected Discharge Date:                  Expected Discharge Plan:  Home w Home Health Services  In-House Referral:     Discharge planning Services  CM Consult  Post Acute Care Choice:    Choice offered to:     DME Arranged:    DME Agency:     HH Arranged:    HH Agency:     Status of Service:  In process, will continue to follow  Medicare Important Message Given:    Date Medicare IM Given:    Medicare IM give by:    Date Additional Medicare IM Given:    Additional Medicare Important Message give by:     If discussed at Long Length of Stay Meetings, dates discussed:    Additional Comments:  Leone Haven, RN 07/31/2015, 11:52 AM

## 2015-08-09 NOTE — Progress Notes (Signed)
FMTS Attending Note: Sabrina Mcmurray MD EVENT NOTE:  While on rounds this morning, approx 11:00 AM, , we received a call from nursing stating patient had an unwitnessed fall, no injury noted, no LOC; patient was answering questions appropriately. As a team, we decided to initiate transfer to SDU as she had fallen, and given her other co morbidities, we thought she was likely going to need a higher level of care.  We discussed at rounds and ultimately decided to order a  transfer to SDU at about 11:20 AM; it was not a STAT order. She remained on the [place] floor with transfer pending.   Nursing continued to monitor and her daughter was at bedside until around 12:30 when nursing noted she was increasingly sleepy.  FMTS team was notified about 12:50 that she had decease in her level of arousal, was evidently having difficulty following commands. Dr. Brett Albino and Dr. Lorenso Courier went to evaluate. I received a text page from Dr. Brett Albino about 12:55 pm informing me of patient was having agonal breathing. Code BLUE had been called.  On my arrival, the code team had intubated the patient and she was being adequately ventilated. Pulse was 80s, non responsive.  Given earlier fall, her very rapid decline, underlying alcoholism (high risk for head bleed) we ordered CT scan to be made STAT, ICU team was called. I spoke at length with her daughter, Sabrina Powell,  Who was at bedside during the Code blue. ICU attending also arrived at code. Once stabilized, patient transported immediately to [place]. We had the CT images within a few minutes showing a large intracranial bleed. The on call Neurosurgeon, Dr. Sherwood Gambler, was called and was reviewing her CT images while she was still in radiology.  Dr. Lorenso Courier accompanied patient on transfer to radiology and back to Neuro ICU. I met daughter in waiting room of neuro ICU and informed her of findings, briefly discussed critical nature of events. I then accompanied her to the neuro ICU where she  was able to briefly see her Mom and then speak with Dr. Sherwood Gambler.  I also spoke with Dr. Sherwood Gambler.  Immediate craniotomy planned. I gave Sabrina Powell my pager number should she need anything. Chaplain also visited.

## 2015-08-09 NOTE — Progress Notes (Signed)
eLink Physician-Brief Progress Note Patient Name: Sabrina Powell DOB: 12/17/1945 MRN: [id number]   Date of Service  2015-09-05  HPI/Events of Note  Hgb = 5.8. Platelets = 138K. Jackson-Pratt drain continues to demonstate bleeding.   eICU Interventions  Will order: 1. Transfuse 2 units PRBC's now. 2. Check PT/INR and PTT now.      Intervention Category Intermediate Interventions: Bleeding - evaluation and treatment with blood products  Sommer,Steven Dennard Nip 09-05-15, 9:12 PM

## 2015-08-09 NOTE — Progress Notes (Signed)
Was the fall witnessed: no  Patient condition before and after the fall:  Stable , sleepy  Patient's reaction to the fall: alert, responded she was ok, assisted back to bed  Name of the doctor that was notified including date and time: 1113, Dr. Nancy Marus   Any interventions and vital signs: neuro checks, q 1 hour,  Unwitnessed post fall frequent vital signs started, Frequent monitor.

## 2015-08-09 NOTE — Significant Event (Signed)
Rapid Response Event Note  Overview:  Called to assist with assessment of  patient with decreased LOC Time Called: 1251 Arrival Time: 1312 Event Type: Neurologic  Initial Focused Assessment:  On arrival patient supine in bed - lying flat on right side - pale skin warm and moist - snoring resps - rate 26 - pupils right 6 mm left 4mm no reaction noted - disconjugate gaze - no response to voice - with touch has rigid extension of UE - strong pulse - rate 54 - BP 112/45 = stat CBG check 154 - bruising noted over right eye - forehead area - small abrasion to right knee - RN Victorino Dike reports unwitnessed fall this am around 1120 with no LOC at that time - has had progression of AMS - MD notified and examined patient around 20 mins ago.  Now with continued progression of AMS.  Abd large working up for possible ascites per staff report.     Interventions:  HOB elevated to 45 degrees.  Stat call to Dr. Leonides Schanz for reevaluation.   Daughter at bedside - updates given.  Preparing to place on monitor - patient now with continued deterioration of respirations becoming more agonal and increased decerebration (extension of UE ) - needs stat intubation and stat CT scan - Code Blue initiated - BVM began - bagging easily with O2 sats 100%.  No loss of pulse, BP remains stable - RR per BVM - RT arrives - patient clinching - unable to visualize for intubation but BVM with ease - O2 sats 100% BP 123/53 - HR 53 - SB -  Anesthesia arrived - intubated with meds - see Code Blue sheet and anesthesia note. No loss of pulse.  PCCM consulted - speaking with Dr. Leonides Schanz- Dr. Delton Coombes to bedside - airway secure - BP 109/45 HR 54 SB - O2 sats 100% - stat tx to CT scan with RT, MD's and RRT RN.  Stat transfer to 3M10 post scan - handoff to [facility].  Dr. Newell Coral present.  Family updated by Dr. Jennette Kettle and her MD staff, and Dr. Newell Coral.  Handoff to [employer] - family updated and supported.     Event Summary: Name of Physician Notified: Dr. Leonides Schanz     Dr Jennette Kettle at 1312  Name of Consulting Physician Notified: Dr. Delton Coombes at 1340  Outcome: Transferred (Comment) (57m10)  Event End Time: 1415  [place]

## 2015-08-09 NOTE — Progress Notes (Addendum)
FPTS Interim Progress Note- Late note  S:Patient was found on the ground next to the bed. Thought she may be going to the bathroom. Husband did not think she hit her head. No trauma reported initially, patient in no pain and acting normally. Approximately 30 minutes later, daugther noted bruising on R forehead and felt she was more drowsy. Patient unable to answer questions for me.   O: BP 112/45 mmHg  Pulse 66  Temp(Src) 97.6 F (36.4 C) (Oral)  Resp 22  Ht 5' (1.524 m)  Wt 118 lb (53.524 kg)  BMI 23.05 kg/m2  SpO2 95%  Lying in bed in NAD, does not reply but intermittently mumbles things. Circular green/yellow hematoma. Small green (with possible purple hue) swelling under the R eye. No other head trauma noted. No other bruising noted on skin exam except erythema over the L buttock which daughter/RN states is stable. No TTP over the major joints. Abrasion over R knee covered. Breathing comfortably on RA.  Pt able to lightly squeeze hands on exam.  Will not follow other commands or open eyes. Scleral icterus. Pupils slightly dilated, center.  PERRLA.  Neutral Babinski.    A/P: Odd that the bruise is green in color, unsure if this is a new or old hematoma (however not documented). Will get STAT CT of the head given neurologic changes. Already had SDU transfer orders, per RN will give report and transfer soon.  Joanna Puff, MD 07/29/2015, 1:19 PM PGY-2 , [facility] pager [phone]  Addendum: after speaking the RN again during her Code , it sounds as though the daughter noticed the bruising approximately 1 hour after the fall.

## 2015-08-09 NOTE — Progress Notes (Signed)
12:20 Came to reassess pt and give daily medications.  Daughter at bedside.  Pt sleeping, but arousable.  Pupils equal, round, reactive, and brisk.  Vitals unchanged.    12:25 Attempted to give daily medications, but noticed pt was not able to stay awake long enough to safely swallow medications.  Asked daughter if she could remember her being drowsy like this the last time she had ativan.  Daughter stated "she slept really well from 1am until 4am."  Daughter next mentioned she had noticed a bruise to her right side of her face.  VSS.    12:30 Returned to room to start IV.  Pt seemed increasingly drowsy.  Charge nurse, Shinita, stopped by to help complete IV start so this RN could call report to have pt transferred and noticed the change in her orientation.  This RN decided to page attending physician about the change in her orientation, the bruise to her right side of her face and recommend a head CT.  Rapid response notified simultaneously by Shinita.  12:53 Dr. Nancy Marus returned phone call and stated "We would like to reassess the pt before we order the head CT, but will be up shortly."  Received return phone from Dr. Nancy Marus shortly after and requested, per Dr. Jennette Kettle, to order the head CT.  Head CT ordered STAT.  1304 Received phone call from Reuel Boom in radiology and told that transport will be coming shortly.  Vitals remained stable.  Rapid response to see pt.  Waiting for family medicine team to come by.  Will continue to monitor pt.

## 2015-08-09 NOTE — Progress Notes (Signed)
Progress Note- Late Note: Got a page from [place], [place] around 1140 this morning. States that patient was found on the floor next to the bed. Patient did not think that she hit her head. Husband did not think that patient hit her head. Nurse states that patient does not have a headache and is acting normally. Patient also did not have any trauma to her hands/wrists from the fall. Victorino Dike, RN only notes of a scrap on her knee from the fall.  Willadean Carol, MD Family Medicine

## 2015-08-09 NOTE — Progress Notes (Signed)
Family Medicine Teaching Service Daily Progress Note Intern Pager: [phone]  Patient name: Sabrina Powell Medical record number: [id number] Date of birth: [birth year] Age: 69 y.o. Gender: female  Primary Care Provider: Beverlyn Roux, MD Consultants: None  Code Status: Full   Pt Overview and Major Events to Date:  9/27: Patient admitted   Assessment and Plan: Sabrina Powell is a 69 y.o. female presenting with alcohol withdrawal and malnutrition. PMH is significant for alcohol abuse, elevated liver enzymes, HTN, HLD hypothyroidism, asthma, mood disorder, and anxiety.  Alcohol abuse: Acute on chronic, recently tapered down to 1/2 pint per day, last drink 9/27 at ~12:00pm. Hemodynamically stable.  - CIWA protocol: scores 0-2; given ativan 1 mg x2  - MVI, thiamine and folate - Seizure and fall precautions: Avoid tramadol - Continue to develop more robust plan for continued abstinence following discharge. Social work consulted.   Suspected alcoholic cirrhosis: Elevation of liver-associated enzymes: with both hepatocellular toxicity (AST > ALT) and cholestatic (elevated alk phos, bilirubin) patterns. Synthetic function impaired mildly (INR 1.33, albumin 1.8 in setting of poor po) but not failing. Suspected alcoholic cirrhosis related to laboratory abnormalities above, macrocytic anemia, hyponatremia and exam with abdominal distention and + fluid wave. No obvious hepatorenal syndrome. Elevated ferritin with concern raised for hemochromatosis. Would suspect this raised as acute phase reactant given leukocytosis and with alternative cause of liver damage. Could consider iron and TIBC and recheck ferritin following resolution of current hospitalization if Tsat > 45%  - RUQ U/S : Increased echogenicity of hepatic parenchyma suggesting fatty infiltration or cirrhosis. No cholelithiasis. Gallbladder wall thickening which may be due to surrounding ascites.  - Consider diagnostic paracentesis and GI  consult.  - Lasix $Remov'40mg'SbePGA$ , spironolactone $RemoveBeforeDE'100mg'ltPPijuOkHbmqWt$  to start 9/28, salt/fluid restriction - Hepatitis vaccination pending immunity from hepatitis panel, no documented Hep A/B immunization on file -hepatitis panel pending   Neutrophilic leukocytosis: with a left shift in the absence of apparent infective source. Tachycardia thought to be due to withdrawal. No cough, urinary symptoms or obvious cutaneous sites.  - Blood cultures 9/28 pending  - Low threshold for abx if becomes febrile  - Consider ascitic fluid analysis as above  Hypothyroidism: TSH checked at the clinic significantly elevated. - Consider starting low dose synthroid 6mcg, though not sure of safety in acute EtOH withdrawal. Will discuss with team in AM   Electrolyte imbalance: Hyponatremia, hypokalemia. - Fluid restriction for hyponatremia. - Consider nutrition consult -Na 134 and K 4.1 (9/28)   Macrocytic anemia: Gradual declined in hemoglobin level from baseline (11-8). - Folate wnl, B12 >2000 - likely 2/2 alcoholism - Transfusion threshold of less than 7  Hyperglycemia: Spurious result from labs drawn downstream of D5 infusion (repeat POC CBG $Remov'137mg'rNgvJS$ /dl) - Repeat BMP in AM: Glucose 173  -HgB A1 C 4.9 (9/8)   HTN/HLD - Carvedilol 6.25 BID - Fish oil, patient reports not taking, f/u w/PCP  FEN/GI: regular, fluid and salt restricted diet with protein boost Prophylaxis: enoxaparin   Disposition: ?Rehab inpt vs outpatient   Subjective:  Just received Ativan, so very sleepy during exam. Reports anxiety and diarrhea. Denies feeling shaky or tachycardic. Per family, she went through "DTs" in 1994 but patient and daughter are unsure if she had seizures or hallucinations.   Objective: Temp:  [98.1 F (36.7 C)-98.3 F (36.8 C)] 98.2 F (36.8 C) (09/28 0809) Pulse Rate:  [81-116] 86 (09/28 0809) Resp:  [16-22] 22 (09/28 0809) BP: (107-150)/(36-70) 107/62 mmHg (09/28 0809) SpO2:  [95 %-97 %] 97 % (  09/28 0809) Weight:   [118 lb (53.524 kg)] 118 lb (53.524 kg) (09/27 1700) Physical Exam: General: lying in bed, sleepy in NAD  Cardiovascular: RRR  Respiratory: CTAB  Abdomen: +BS, soft, NT, ascites, no caput medusae  Extremities: 2+ LE edema to shins   Laboratory:  Recent Labs Lab 08/10/2015 1720 07/31/2015 0528  WBC 25.6* 18.3*  HGB 8.6* 8.2*  HCT 26.3* 25.4*  PLT 323 297    Recent Labs Lab 07/19/2015 1720 08/03/2015 0528  NA 132* 134*  K 3.3* 4.1  CL 101 103  CO2 18* 21*  BUN 19 19  CREATININE 0.96 0.98  CALCIUM 7.3* 7.8*  PROT 5.1*  --   BILITOT 3.7*  --   ALKPHOS 537*  --   ALT 46  --   AST 196*  --   GLUCOSE 613* 173*     Imaging/Diagnostic Tests: EXAM: US ABDOMEN LIMITED - RIGHT UPPER QUADRANT  COMPARISON: None.  FINDINGS: Gallbladder:  No gallstones are noted. Gallbladder wall is thickened at 4.7 mm, which may be due to surrounding ascites. No sonographic Murphy's sign is noted.  Common bile duct:  Diameter: 5.3 mm which is within normal limits.  Liver:  No focal abnormality is noted. Increased and coarsened echogenicity of hepatic parenchyma is noted suggesting fatty infiltration or diffuse hepatocellular disease.  IMPRESSION: No cholelithiasis is noted, but gallbladder wall thickening is noted which may be due to surrounding ascites.  Increased and coarse echogenicity of hepatic parenchyma is noted suggesting fatty infiltration or other diffuse hepatocellular disease such as cirrhosis. No focal abnormality is noted.  Nicolette Bang, DO 07/25/2015, 8:26 AM PGY-1, [place] Intern pager: [phone], text pages welcome

## 2015-08-09 NOTE — Progress Notes (Signed)
Report given to Florentina Addison, Charity fundraiser.  Pt transported to CT.

## 2015-08-09 NOTE — Progress Notes (Signed)
Called to bedside via Code Blue pager.  Upon entering room, noted RN bagging pt on 100% fio2 ambu bag, noted pt w/ agonal spontaneous breathing respirations.  Pt was bagging easily w/ good air movement, sats 96-100% via bagging.  Anesthesia at bedside and anesthesia intubated pt w/ 7.5 OET 21 at lip.  + BBSH = coarse diminished (purple to yellow easy cap color change), ETCO2 34. Pt was bagged on 100% fio2 to CT scan by Mirian Mo, RRT, RCP w/ no apparent complications.  Once pt in CT scan, pt placed on ventilator 21ml/kg, 5 peep, rate 14, 100% fio2- pt tol well, VSS.  Pt brought to ICU via vent, report given to ICU RT, ETCO2 28-34 t/o entire transports and CT scan.  RN's at bedside. MD at bedside to eval.

## 2015-08-09 NOTE — Progress Notes (Signed)
   07/14/2015 1607  Clinical Encounter Type  Visited With Family;Health care provider  Visit Type Initial  Referral From Nurse   Chaplain met with family of a new admission to 32M. Chaplain offered support and is available as needed.   Jeri Lager, Chaplain 07/27/2015 4:08 PM

## 2015-08-09 NOTE — Progress Notes (Signed)
Subjective: Patient in ICU, on ventilator via ETT. Moderate drainage into the Jackson-Pratt drain.   Objective: Vital signs in last 24 hours: Filed Vitals:   08/03/2015 1818 07/25/2015 1830 08/10/2015 1845 08/02/2015 1900  BP:  98/50 95/52 102/53  Pulse: 65 61 58 58  Temp:    94.3 F (34.6 C)  TempSrc:    Rectal  Resp: Height:      Weight:      SpO2: 100% 100% 100% 100%    Physical Exam:  Not opening eyes, pupils 3 mm bilaterally, round, not reactive to light. No response to pain  CBC  Recent Labs  08/03/2015 1720 08/04/2015 0528  WBC 25.6* 18.3*  HGB 8.6* 8.2*  HCT 26.3* 25.4*  PLT 323 297   BMET  Recent Labs  08/06/2015 1720 08/05/2015 0528  NA 132* 134*  K 3.3* 4.1  CL 101 103  CO2 18* 21*  GLUCOSE 613* 173*  BUN 19 19  CREATININE 0.96 0.98  CALCIUM 7.3* 7.8*    Assessment/Plan: Patient stable, but critically ill in 41M ICU.  Discussed plan of care from neurosurgical perspective with nursing staff. CCM to manage ventilator, fluids, hemodynamics, etc. Spoke with patient's family including her husband, daughter, son-in-law, grandson, and granddaughter. Discussed intraoperative findings, difficulty establishing hemostasis contributed to by liver dysfunction and malnutrition, improved pupillary size, but most and poorly discussed overall poor prognosis for survival, better yet recovery. Their questions regarding her condition were answered for them. They understand the very critical condition of the patient, and in particular the uncertainty of survival from this injury.   Hewitt Shorts, MD 08/05/2015, 7:14 PM

## 2015-08-09 NOTE — Progress Notes (Signed)
FPTS Interim Progress Note  Patient found down on the ground next to her bed. Fall was unwitnessed but patient denied pain, hitting her head after fall. Per nursing report, patient was not confused after the fall. Later, daughter noted bruising on R forehead and thought mother seemed more drowsy than normal.  When I evaluated her with Dr. Leonides Schanz, patient was unable to answer questions and would not open her eyes. This was a change from her mentation from exam this AM. A circular green/yellow hematoma was present on the right forehead/temple and there was an area of small green swelling under her R eye. No other bruising was noted and joints were not TTP. A very small abrasion with slight bleeding was noted on her right knee. Pupils were slightly dilated, equal in size, round, and reactive to light. She was breathing comfortably on RA.   A stat CT of the head was ordered given her neurological changes. A SDU transfer was already pending.   Approximately 20-25 minutes later rapid response was called and patient was found to have unequal pupils with agonal breathing. Patient was sedated and intubated by anesthesia. Patient became bradycardic to the 50s.   She was taken to CT. CT head revealed a large subdural hematoma, likely acute on chronic measuring up to 18 mm with 16mm of right to left midline shift. Neurosurgery was consulted and will proceed with emergent craniotomy.   Arvilla Market, DO 28-Aug-2015, 2:42 PM PGY-1, [facility] Family Medicine Service pager [phone]

## 2015-08-09 NOTE — Consult Note (Signed)
PULMONARY / CRITICAL CARE MEDICINE   Name: Sabrina Powell MRN: [id number] DOB: May 24, 1946    ADMISSION DATE:  August 17, 2015 CONSULTATION DATE:  07/22/2015  REFERRING MD :  Lenise Herald  CHIEF COMPLAINT:  AMS  INITIAL PRESENTATION: 69 year old female with PMH ETOH abuse admitted to [facility] 9/27 s/p fall. At time of admission she had no neurological deficits, however 9/28 she was found lying on floor next to bed with decreased LOC, which quickly deteriorated to agonal respirations requiring intubation. Patient was sent for head CT and PCCM consulted for ICU transfer.   STUDIES:  CT head 9/28 > Large R acute on chronic SDH with midline shift 9/28 Abd [place] > Increased and coarse echogenicity of hepatic parenchyma is noted suggesting fatty infiltration or other diffuse hepatocellular disease such as cirrhosis. Ascites  SIGNIFICANT EVENTS: 9/27 admit for ETOH and malnutrition 9/28 fall, decreased LOC and new neuro deficits, intubated > ICU  HISTORY OF PRESENT ILLNESS:  69 year old female with PMH as below, which includes ETOH abuse, HTN, HLD, Hypothyroidism, and depression. About 3 years ago there was an event in her family that caused her to become depressed. This is when her regular drinking of alcohol began, however, recently she lost her job and started drinking even more. She was up to about a half pint of vodka daily. According to recent notes from her PCP [facility]) she has been trying to cut down on this, and had been exhibiting signs of withdrawal such as tremors. LFTs have also been elevated concerning for the development of alcoholic cirrhosis. She has also been falling more frequently of late. She thought this was because of Lexapro, so she stopped taking it.   9/27 she presented to ED with complaints of fatigue, tremors, and loss of appetite. She was admitted to [facility] for Liver disease and ETOH withdrawal.  9/28 AM she was in her USOH on rounds, however, shortly after she was found down at the  side of the bed with bruising to face. She was placed back into bed and was noted to be sleepier than she had been. Upon rapid response arrival the patient was minimally responsive and a code was called. She was intubated and taken for head CT. Then to ICU. PCCM to assume care. Neurosurgery to take to OR.   PAST MEDICAL HISTORY :   has a past medical history of HTN (hypertension); HLD (hyperlipidemia); Overweight(278.02); Allergic rhinitis; Asthma, mild persistent; IDA (iron deficiency anemia); Pneumonia (1994); Hypothyroidism; Depression; Anxiety; and Alcoholism /alcohol abuse.  has past surgical history that includes Breast lumpectomy (Left, 1970). Prior to Admission medications   Medication Sig Start Date End Date Taking? Authorizing Provider  albuterol (PROAIR HFA) 108 (90 BASE) MCG/ACT inhaler 2 puffs inhaled every 4 hours as needed shortness of breath.  Dispense generic. Patient taking differently: Inhale 2 puffs into the lungs every 4 (four) hours as needed. 2 puffs inhaled every 4 hours as needed shortness of breath.  Dispense generic. 03/13/15  Yes Abram Sander, MD  aspirin 81 MG tablet Take 81 mg by mouth daily.   Yes Historical Provider, MD  Budesonide (PULMICORT FLEXHALER) 90 MCG/ACT inhaler Inhale 1 puff into the lungs 2 (two) times daily. regularly Patient taking differently: Inhale 1 puff into the lungs 2 (two) times daily as needed.  03/13/15  Yes Abram Sander, MD  Calcium Carbonate 1500 MG TABS Take 1,500 mg by mouth daily.    Yes Historical Provider, MD  carvedilol (COREG) 6.25 MG  tablet Take 1 tablet (6.25 mg total) by mouth 2 (two) times daily with a meal. 07/19/15  Yes Abram Sander, MD  fluticasone (FLOVENT HFA) 110 MCG/ACT inhaler Inhale 1 puff into the lungs 2 (two) times daily. Patient taking differently: Inhale 1 puff into the lungs 2 (two) times daily as needed.  01/18/14  Yes Abram Sander, MD  Multiple Vitamin (MULTIVITAMIN) capsule Take 1 capsule by mouth daily.     Yes  Historical Provider, MD  naphazoline-pheniramine (NAPHCON-A) 0.025-0.3 % ophthalmic solution Place 1 drop into both eyes 4 (four) times daily as needed for irritation.   Yes Historical Provider, MD  Omega-3 Fatty Acids (FISH OIL) 1200 MG CAPS Take 1 capsule by mouth daily. Take 2 tabs daily   Yes Historical Provider, MD  aspirin EC 81 MG tablet Take 1 tablet (81 mg total) by mouth daily. 01/18/14 01/18/15  Abram Sander, MD  fluticasone (FLONASE) 50 MCG/ACT nasal spray Place 1 spray into the nose daily. 02/08/13 02/08/14  Jacquelyn A McGill, MD  sertraline (ZOLOFT) 100 MG tablet Take 1 tablet (100 mg total) by mouth daily. Patient not taking: Reported on 07/23/2015 07/14/15   Abram Sander, MD   Allergies  Allergen Reactions  . Amlodipine Besylate     REACTION: LE edema    FAMILY HISTORY:  indicated that her mother is deceased.  SOCIAL HISTORY:  reports that she quit smoking about 21 years ago. Her smoking use included Cigarettes. She has a 50 pack-year smoking history. She has never used smokeless tobacco. She reports that she drinks about 21.0 oz of alcohol per week. She reports that she does not use illicit drugs.  REVIEW OF SYSTEMS: unable  SUBJECTIVE:   VITAL SIGNS: Temp:  [97.6 F (36.4 C)-98.3 F (36.8 C)] 97.6 F (36.4 C) (09/28 1115) Pulse Rate:  [45-116] 66 (09/28 1240) Resp:  [16-22] 22 (09/28 0809) BP: (96-164)/(36-144) 112/45 mmHg (09/28 1240) SpO2:  [94 %-100 %] 95 % (09/28 1240) Weight:  [53.524 kg (118 lb)] 53.524 kg (118 lb) (09/27 1700) HEMODYNAMICS:   VENTILATOR SETTINGS:   INTAKE / OUTPUT:  Intake/Output Summary (Last 24 hours) at 07/13/2015 1351 Last data filed at 07/24/2015 0931  Gross per 24 hour  Intake      0 ml  Output    300 ml  Net   -300 ml    PHYSICAL EXAMINATION: General:  Female in NAD on vent Neuro:  Completely unresponsive. Only decerebrate posturing to pain. Pupils unreactive  HEENT:  Ecchymosis to R frontotemporal area Cardiovascular:   Bradycardic, regular Lungs:  Clear bilateral breath sounds Abdomen:  Distended, fluctuant Musculoskeletal:  No acute deformity Skin:  Grossly intact  LABS:  CBC  Recent Labs Lab 07/21/2015 1720 07/28/2015 0528  WBC 25.6* 18.3*  HGB 8.6* 8.2*  HCT 26.3* 25.4*  PLT 323 297   Coag's  Recent Labs Lab 07/21/2015 1720  INR 1.33   BMET  Recent Labs Lab 07/29/2015 1720 07/29/2015 0528  NA 132* 134*  K 3.3* 4.1  CL 101 103  CO2 18* 21*  BUN 19 19  CREATININE 0.96 0.98  GLUCOSE 613* 173*   Electrolytes  Recent Labs Lab 07/16/2015 1720 07/24/2015 0528  CALCIUM 7.3* 7.8*  MG 1.7  --   PHOS 2.9  --    Sepsis Markers No results for input(s): LATICACIDVEN, PROCALCITON, O2SATVEN in the last 168 hours. ABG No results for input(s): PHART, PCO2ART, PO2ART in the last 168 hours. Liver Enzymes  Recent Labs  Lab 07/21/2015 1720  AST 196*  ALT 46  ALKPHOS 537*  BILITOT 3.7*  ALBUMIN 1.8*   Cardiac Enzymes No results for input(s): TROPONINI, PROBNP in the last 168 hours. Glucose  Recent Labs Lab 08/06/2015 1813  GLUCAP 137*    Imaging US Abdomen Limited Ruq  2015-08-11   CLINICAL DATA:  Hepatic cirrhosis with alcoholism.  EXAM: US ABDOMEN LIMITED - RIGHT UPPER QUADRANT  COMPARISON:  None.  FINDINGS: Gallbladder:  No gallstones are noted. Gallbladder wall is thickened at 4.7 mm, which may be due to surrounding ascites. No sonographic Murphy's sign is noted.  Common bile duct:  Diameter: 5.3 mm which is within normal limits.  Liver:  No focal abnormality is noted. Increased and coarsened echogenicity of hepatic parenchyma is noted suggesting fatty infiltration or diffuse hepatocellular disease.  IMPRESSION: No cholelithiasis is noted, but gallbladder wall thickening is noted which may be due to surrounding ascites.  Increased and coarse echogenicity of hepatic parenchyma is noted suggesting fatty infiltration or other diffuse hepatocellular disease such as cirrhosis. No focal  abnormality is noted.   Electronically Signed   By: Lupita Raider, M.D.   On: August 11, 2015 08:04     ASSESSMENT / PLAN:  PULMONARY OETT 9/28 >  A: VDRF in setting of subdural hematoma  P:   Full vent support, assess for SBT post-op depending on MS ABG CXR for ETT placement VAP bundle  CARDIOVASCULAR A:  Bradycardia, likely in setting ICP change H/o HTN, HLD  P:  Tele MAP goal >25mm/Hg Dc home lasix, spironolactone, coreg, ASA  RENAL A:   Lactic acidosis Mild hyponatremia  P:   Repeat lactic to ensure clearing Follow Bmet  GASTROINTESTINAL A:   Suspect alcoholic cirrhosis Possible GI bleed - melena noted Concern varices  P:   Hemoccult  Protonix bolus, gtt Consider octreotide with cirrhosis and risk varices May need GI consult Transfuse as below  HEMATOLOGIC A:   Anemia chronic illness Consider acute blood loss superimposed  P:  D/c lovenox SCDs Trend CBC, Coags  INFECTIOUS A:   Leukocytosis - suspect reactive  P:   BCx2 9/28 >>> UC 9/28 >>> Surgical prophylaxis if indicated per neurosurgery Consider SBP coverage if WBC does not improve or spikes fever Check PCT  ENDOCRINE A:   Hyperglycemia without history of DM Hypothyroid   P:   Transition synthroid to IV dose TSH CBG monitoring and SSI  NEUROLOGIC A:   Acute on chronic subdural hematoma ETOH withdrawal ? Seizures  P:   To OR for decompression under Neurosurgery direction RASS goal: 0 PRN fentanyl for analgesia PRN ativan for seizures, withdrawal associated agitation only Minimize sedating medications as much as possible.  Keppra 500mg  BID  FAMILY  - Updates:   - Inter-disciplinary family meet or Palliative Care meeting due by:  8/5   Joneen Roach, AGACNP-BC [place] Pulmonology/Critical Care Pager [phone] or [phone] 08-11-2015 3:05 PM    Attending Note:  I have examined patient, reviewed labs, studies and notes. I have discussed the case with Henreitta Leber, and I agree with the data and plans as amended above. She has a hx EtOH abuse, was admitted for detox. Was found in her room after a fall, unwitnessed. unclear whether she may have seized. Became obtunded and was urgently intubated for airway protection. On my eval she has dilated pupils of different sizes, is unresponsive, ventilated, hemodynamically stable but bradycardic. Large SDH noted on Head CT. We will support her on MV, initiate  Keppra for seizure prophylaxis, start protonix for possible evidence GIB. Will follow MS post-drainage of SDH.  Independent critical care time is 60  minutes.   Levy Pupa, MD, PhD 08-13-2015, 3:18 PM [facility] [phone] or if no answer [phone]

## 2015-08-09 NOTE — Assessment & Plan Note (Signed)
TSH 16, FT4 0.7, strong family history - start synthroid - f/u in 6 weeks to titrate

## 2015-08-09 NOTE — Progress Notes (Addendum)
Was called by a physician to come to room and told that pt had a fall.  When arrived to room, saw pt on the floor lying on her side with Dr. Dimple Casey and Dr. Drue Second at her side.  Pt was alert and responsive.  This RN and both physicians were able to pick her up to a sitting position and help pt walk back to bed.  Pt denied hitting her head and denied any pain.  Found abrasion to right knee, but no other signs of bruising or injury.  Pt was able to answer questions appropriately and clearly.  Pupils were equal, round, reactive and brisk.  Charge nurse, Shinita made aware of fall and primary physicians made aware.  VSS.  Initiated unwitnessed post fall assessment.  Will continue to monitor.

## 2015-08-09 NOTE — Progress Notes (Signed)
   Subjective:   Sabrina Powell is a 69 y.o. female with a history of HTN, alcohol abuse here for f/u lab abnormalities and alcoholism.  Patient reports feeling bad for the past 2 years, alcohol use has excalated significantly in that time to a pint daily, she has been trying to cut back for the last few months. She has her first drink within an hour of waking and her family is very concerned about the amount that she drinks.  Review of Systems:  Per HPI. All other systems reviewed and are negative.   PMH, PSH, Medications, Allergies, and FmHx reviewed and updated in EMR.  Social History: former smoker  Objective:  BP 150/64 mmHg  Pulse 116  Temp(Src) 98.2 F (36.8 C) (Oral)  Ht 5' (1.524 m)  Wt 118 lb (53.524 kg)  BMI 23.05 kg/m2  Gen:  69 y.o. female in NAD HEENT: NCAT, MMM, EOMI, PERRL, anicteric sclerae CV: RRR, no MRG, no JVD Resp: Non-labored, CTAB, no wheezes noted Abd: Soft, NT, mildly distended, BS present, no guarding or organomegaly Ext: WWP, no edema MSK: Full ROM, strength intact Neuro: Alert and oriented, speech normal      Chemistry      Component Value Date/Time   NA 134* 07/15/2015 0528   K 4.1 08/10/2015 0528   CL 103 07/22/2015 0528   CO2 21* 07/18/2015 0528   BUN 19 07/23/2015 0528   CREATININE 0.98 07/19/2015 0528   CREATININE 0.70 07/14/2015 0927      Component Value Date/Time   CALCIUM 7.8* 08/05/2015 0528   ALKPHOS 537* 07/26/2015 1720   AST 196* 08/02/2015 1720   ALT 46 07/26/2015 1720   BILITOT 3.7* 07/16/2015 1720      Lab Results  Component Value Date   WBC 18.3* 07/27/2015   HGB 8.2* 08/02/2015   HCT 25.4* 07/17/2015   MCV 110.0* 08/11/2015   PLT 297 07/20/2015   Lab Results  Component Value Date   TSH 16.118* 07/19/2015   Lab Results  Component Value Date   HGBA1C 4.9 08/06/2015   Assessment & Plan:     Sabrina Powell is a 69 y.o. female here for alcohol abuse  Hypothyroidism TSH 16, FT4 0.7, strong family  history - start synthroid - f/u in 6 weeks to titrate  Alcohol abuse Multiple lab abnormalities suggesting alcoholic cirrhosis, drinks 1/2-1 pint vodka daily, wants to quit and has been cutting back gradually, Past 3 visits with tremors and tachycardia consistent with withrawal - had a long conversation about options with patient and daughter who is very concerned - will admit to inpatient for detox and work-up of possible cirrhosis       Beverely Low, MD, MPH [facility] PGY-3 08/08/2015 12:14 PM

## 2015-08-09 NOTE — Progress Notes (Signed)
Code Blue Note: CODE BLUE NOTE  Patient Name: Sabrina Powell   MRN: [id number]   Date of Birth/ Sex: [birth year] , female      Admission Date: 2015/09/02  Attending Provider: Nestor Ramp, MD  Primary Diagnosis: Alcohol abuse    Indication: Pt was in her usual state of health until this AM, when she was found down on the ground in her hospital room. Her fall was unwitnessed but she stated that she did not think she hit her head. She was evaluated and was mentating normally. 30 minutes later, she was noted to be drowsy with a green bruise under her L eye and on her L temple. She was evaluated again by the primary team and was drowsy with equal and reactive pupils. The primary team ordered a stat CT head. 10-15 minutes later, rapid response team was called and she was found to have unequal pupils with agonal breathing. Code blue was subsequently called. At the time of arrival on scene, ACLS protocol was underway.    Technical Description:  - CPR performance duration:  No CPR was performed  - Was defibrillation or cardioversion used? No   - Was external pacer placed? No  - Was patient intubated pre/post CPR? Patient was intubated by anesthesia    Medications Administered: Y = Yes; Blank = No Amiodarone    Atropine    Calcium    Epinephrine    Lidocaine    Magnesium    Norepinephrine    Phenylephrine    Sodium bicarbonate    Vasopressin      Post CPR evaluation:  - Final Status - Was patient successfully resuscitated ? Yes - What is current rhythm? Normal sinus rhythm - What is current hemodynamic status? Hemodynamically stable   Miscellaneous Information:  - Labs sent, including: Ammonia, lactic acid  - Primary team notified?  Yes  - Family Notified? Yes  - Additional notes/ transfer status: Pt sent to CT and then to Neuro ICU for further management.        Campbell Stall, MD  07/17/2015, 2:07 PM

## 2015-08-09 NOTE — Consult Note (Signed)
Reason for Consult:  Subdural hematoma, coma Referring Physician:  Dr. Dorcas Mcmurray  Sabrina Powell is an 69 y.o. female.  HPI: Patient admitted to the family practice service last night for alcohol withdrawal and assessment of liver disease. Patient apparently fell at 11 AM this morning, she became increasingly drowsy, then developed sonorous respirations. Rapid response was called. Her heart rate was found to be in the 50s and she was noted be decerebrating. Anesthesia service was called the patient was intubated and transferred to CT scan, and then transferred to the [place] ICU. Neurosurgical consultation was requested by Dr. Nori Riis. Patient was given paralytic agents for her intubation.  Past Medical History:  Past Medical History  Diagnosis Date  . HTN (hypertension)   . HLD (hyperlipidemia)   . Overweight(278.02)   . Allergic rhinitis   . Asthma, mild persistent   . IDA (iron deficiency anemia)   . Pneumonia 1994  . Hypothyroidism   . Depression   . Anxiety   . Alcoholism /alcohol abuse     one prior episode of DTs     Past Surgical History:  Past Surgical History  Procedure Laterality Date  . Breast lumpectomy Left 1970    benign breast mass    Family History:  Family History  Problem Relation Age of Onset  . Hypertension Mother   . Cancer Father     Social History:  reports that she quit smoking about 21 years ago. Her smoking use included Cigarettes. She has a 50 pack-year smoking history. She has never used smokeless tobacco. She reports that she drinks about 21.0 oz of alcohol per week. She reports that she does not use illicit drugs.  Allergies:  Allergies  Allergen Reactions  . Amlodipine Besylate     REACTION: LE edema    Medications: I have reviewed the patient's current medications.  ROS:  Unobtainable due to the patient's comatose state.  Physical Examination: Elderly white female, intubated, supported on a ventilator. Blood pressure 112/45, pulse 64,  temperature 97.6 F (36.4 C), temperature source Oral, resp. rate 19, height _0  (1.575 m), weight 53.524 kg (118 lb), SpO2 100 %.  Eyes icteric. Abdomen protuberant. Not opening eyes to voice or pain. Pupils 6-7 mm bilaterally, round, nonreactive to light. No corneal reflex. Decerebrate to central pain.   Results for orders placed or performed during the hospital encounter of 08/03/2015 (from the past 48 hour(s))  Comprehensive metabolic panel     Status: Abnormal   Collection Time: 07/28/2015  5:20 PM  Result Value Ref Range   Sodium 132 (L) 135 - 145 mmol/L   Potassium 3.3 (L) 3.5 - 5.1 mmol/L   Chloride 101 101 - 111 mmol/L   CO2 18 (L) 22 - 32 mmol/L   Glucose, Bld 613 (HH) 65 - 99 mg/dL    Comment: CRITICAL RESULT CALLED TO, READ BACK BY AND VERIFIED WITH: L WHITTAKER,RN 1759 07/27/2015 D BRADLEY QUESTIONABLE RESULTS, RECOMMEND RECOLLECT TO VERIFY    BUN 19 6 - 20 mg/dL   Creatinine, Ser 0.96 0.44 - 1.00 mg/dL   Calcium 7.3 (L) 8.9 - 10.3 mg/dL   Total Protein 5.1 (L) 6.5 - 8.1 g/dL   Albumin 1.8 (L) 3.5 - 5.0 g/dL   AST 196 (H) 15 - 41 U/L   ALT 46 14 - 54 U/L   Alkaline Phosphatase 537 (H) 38 - 126 U/L   Total Bilirubin 3.7 (H) 0.3 - 1.2 mg/dL   GFR calc non Af Amer >60 >  60 mL/min   GFR calc Af Amer >60 >60 mL/min    Comment: (NOTE) The eGFR has been calculated using the CKD EPI equation. This calculation has not been validated in all clinical situations. eGFR's persistently <60 mL/min signify possible Chronic Kidney Disease.    Anion gap 13 5 - 15  Magnesium     Status: None   Collection Time: 07/17/2015  5:20 PM  Result Value Ref Range   Magnesium 1.7 1.7 - 2.4 mg/dL  Phosphorus     Status: None   Collection Time: 07/19/2015  5:20 PM  Result Value Ref Range   Phosphorus 2.9 2.5 - 4.6 mg/dL  CBC WITH DIFFERENTIAL     Status: Abnormal   Collection Time: 07/29/2015  5:20 PM  Result Value Ref Range   WBC 25.6 (H) 4.0 - 10.5 K/uL   RBC 2.38 (L) 3.87 - 5.11 MIL/uL    Hemoglobin 8.6 (L) 12.0 - 15.0 g/dL   HCT 26.3 (L) 36.0 - 46.0 %   MCV 110.5 (H) 78.0 - 100.0 fL   MCH 36.1 (H) 26.0 - 34.0 pg   MCHC 32.7 30.0 - 36.0 g/dL   RDW 20.2 (H) 11.5 - 15.5 %   Platelets 323 150 - 400 K/uL   Neutrophils Relative % 84 %   Lymphocytes Relative 9 %   Monocytes Relative 5 %   Eosinophils Relative 2 %   Basophils Relative 0 %   Neutro Abs 21.5 (H) 1.7 - 7.7 K/uL   Lymphs Abs 2.3 0.7 - 4.0 K/uL   Monocytes Absolute 1.3 (H) 0.1 - 1.0 K/uL   Eosinophils Absolute 0.5 0.0 - 0.7 K/uL   Basophils Absolute 0.0 0.0 - 0.1 K/uL   RBC Morphology POLYCHROMASIA PRESENT    WBC Morphology MILD LEFT SHIFT (1-5% METAS, OCC MYELO, OCC BANDS)     Comment: TOXIC GRANULATION  Protime-INR     Status: Abnormal   Collection Time: 07/19/2015  5:20 PM  Result Value Ref Range   Prothrombin Time 16.6 (H) 11.6 - 15.2 seconds   INR 1.33 0.00 - 1.49  Hemoglobin A1c     Status: None   Collection Time: 07/24/2015  5:20 PM  Result Value Ref Range   Hgb A1c MFr Bld 4.9 4.8 - 5.6 %    Comment: (NOTE)         Pre-diabetes: 5.7 - 6.4         Diabetes: >6.4         Glycemic control for adults with diabetes: <7.0    Mean Plasma Glucose 94 mg/dL    Comment: (NOTE) Performed At: [facility] [place], [place] [id number] Lindon Romp MD VU:[id number]   Glucose, capillary     Status: Abnormal   Collection Time: 07/15/2015  6:13 PM  Result Value Ref Range   Glucose-Capillary 137 (H) 65 - 99 mg/dL  Basic metabolic panel     Status: Abnormal   Collection Time: 07/13/2015  5:28 AM  Result Value Ref Range   Sodium 134 (L) 135 - 145 mmol/L   Potassium 4.1 3.5 - 5.1 mmol/L    Comment: DELTA CHECK NOTED   Chloride 103 101 - 111 mmol/L   CO2 21 (L) 22 - 32 mmol/L   Glucose, Bld 173 (H) 65 - 99 mg/dL   BUN 19 6 - 20 mg/dL   Creatinine, Ser 0.98 0.44 - 1.00 mg/dL   Calcium 7.8 (L) 8.9 - 10.3 mg/dL   GFR calc non Af  Amer 59 (L) >60 mL/min   GFR calc Af Amer >60 >60 mL/min     Comment: (NOTE) The eGFR has been calculated using the CKD EPI equation. This calculation has not been validated in all clinical situations. eGFR's persistently <60 mL/min signify possible Chronic Kidney Disease.    Anion gap 10 5 - 15  CBC     Status: Abnormal   Collection Time: 07/18/2015  5:28 AM  Result Value Ref Range   WBC 18.3 (H) 4.0 - 10.5 K/uL   RBC 2.31 (L) 3.87 - 5.11 MIL/uL   Hemoglobin 8.2 (L) 12.0 - 15.0 g/dL   HCT 25.4 (L) 36.0 - 46.0 %   MCV 110.0 (H) 78.0 - 100.0 fL   MCH 35.5 (H) 26.0 - 34.0 pg   MCHC 32.3 30.0 - 36.0 g/dL   RDW 19.7 (H) 11.5 - 15.5 %   Platelets 297 150 - 400 K/uL  Culture, blood (routine x 2)     Status: None (Preliminary result)   Collection Time: 07/31/2015  5:28 AM  Result Value Ref Range   Specimen Description BLOOD RIGHT ANTECUBITAL    Special Requests BOTTLES DRAWN AEROBIC AND ANAEROBIC 5CC    Culture PENDING    Report Status PENDING   Culture, blood (routine x 2)     Status: None (Preliminary result)   Collection Time: 07/15/2015  5:34 AM  Result Value Ref Range   Specimen Description BLOOD LEFT ANTECUBITAL    Special Requests BOTTLES DRAWN AEROBIC AND ANAEROBIC 10 CC    Culture PENDING    Report Status PENDING   Glucose, capillary     Status: Abnormal   Collection Time: 07/30/2015  1:23 PM  Result Value Ref Range   Glucose-Capillary 156 (H) 65 - 99 mg/dL    Ct Head Wo Contrast  07/13/2015   CLINICAL DATA:  Fall, on anticoagulation. Altered mental status since fall. Right forehead hematoma.  EXAM: CT HEAD WITHOUT CONTRAST  TECHNIQUE: Contiguous axial images were obtained from the base of the skull through the vertex without intravenous contrast.  COMPARISON:  None.  FINDINGS: There is a large right mixed density subdural hematoma. This could reflect acute on chronic subdural hemorrhage. This measures approximately 18 mm in thickness. There is right to left midline shift of 16 mm. No intraparenchymal hemorrhage. No hydrocephalus. No acute  calvarial abnormality.  Visualized paranasal sinuses and mastoids clear. Orbital soft tissues unremarkable.  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   Electronically Signed   By: Rolm Baptise M.D.   On: 07/26/2015 14:14   US Abdomen Limited Ruq  08/07/2015   CLINICAL DATA:  Hepatic cirrhosis with alcoholism.  EXAM: US ABDOMEN LIMITED - RIGHT UPPER QUADRANT  COMPARISON:  None.  FINDINGS: Gallbladder:  No gallstones are noted. Gallbladder wall is thickened at 4.7 mm, which may be due to surrounding ascites. No sonographic Murphy's sign is noted.  Common bile duct:  Diameter: 5.3 mm which is within normal limits.  Liver:  No focal abnormality is noted. Increased and coarsened echogenicity of hepatic parenchyma is noted suggesting fatty infiltration or diffuse hepatocellular disease.  IMPRESSION: No cholelithiasis is noted, but gallbladder wall thickening is noted which may be due to surrounding ascites.  Increased and coarse echogenicity of hepatic parenchyma is noted suggesting fatty infiltration or other diffuse hepatocellular disease such as cirrhosis. No focal abnormality is noted.   Electronically  Signed   By: Marijo Conception, M.D.   On: 07/14/2015 08:04     Assessment/Plan: Elderly female, with a history of alcoholism for several years, whose had some previous unsteadiness, and who is admitted last night for alcohol withdrawal and assessment of liver function, who fell this morning and has had progressively declining neurologic response. CT scan reveals a large frontal subdural hematoma.  I've spoke with Dr. Nori Riis as well as with the patient's daughter Freda Munro and have discussed options. Without emergent craniotomy and evacuation of subdural hematoma the patient will not survive this head injury. However even with craniotomy her prognosis is quite uncertain. I've explained to patient's daughter that  we do not know whether she will survive, regaining consciousness, have a major paralysis or other major neurologic deficit. We also discussed risks of surgery including infection, bleeding, possibly for transfusion, coma, paralysis, and death. Understanding all this the patient's daughter does wish for [place] to proceed with emergent craniotomy.  Hosie Spangle, MD 08/10/2015, 2:36 PM

## 2015-08-09 NOTE — Progress Notes (Signed)
FPTS Interim Progress Note Late Entry  S: Paged by RN concerning calling a rapid response/code blue. Another RN came to check on the patient approximately 20 minutes after I had seen the patient and noted she had agonal breathing, a dilated R pupil, disconjugate gaze, and some posturing. Code Blue had been called on my arrival. Patient undergoing bag valve mask ventilation with good pulses. Patient's HR in the 50s, was previously tachycardic.   O: BP: 110s/40s, HR 50s. Pupils dilated, R was not reactive to light. Posturing. Not opening eyes at all. Not moving her extremities purposefully.   A/P: Patient emergently intubated by anesthesia (please see code blue note). Troponin, lactate, and ammonia was ordered. CCM was consulted.  Our attending, Dr. Jennette Kettle, up dated the daughter.  Once stabilized, I accompanied her to CT to obtain the CT head that was previously ordered (when patient was more stable). CT head revealed a large acute on chronic subdural hematoma measuring up to 18mm with 16mm of left midline shift. I called neurosurgery who evaluated the patient: at that time the patient's eyes were dilated and non-reactive per his report. The patient's daughter was updated by neurosugery.   Care to be transferred to neuro ICU. I appreciate their assistance.    Joanna Puff, MD 07/25/2015, 2:37 PM PGY-2, [facility] pager [phone]

## 2015-08-09 NOTE — Progress Notes (Signed)
RT called to room when pt returned from surgery to place back on vent and obtain an ABG. Patient's vitals were stable.  RT will continue to monitor.

## 2015-08-09 NOTE — Progress Notes (Signed)
Pt transported to OR by CRNA and OR RN. Daughter at bedside and assisted to waiting room.

## 2015-08-09 NOTE — Transfer of Care (Signed)
Immediate Anesthesia Transfer of Care Note  Patient: Sabrina Powell  Procedure(s) Performed: Procedure(s): CRANIOTOMY HEMATOMA EVACUATION SUBDURAL WITH PLACEMENT OF BONE FLAP IN ABDOMEN (N/A)  Patient Location: ICU  Anesthesia Type:General  Level of Consciousness: unresponsive and Patient remains intubated per anesthesia plan  Airway & Oxygen Therapy: Patient remains intubated per anesthesia plan and Patient placed on Ventilator (see vital sign flow sheet for setting)  Post-op Assessment: Report given to RN and Post -op Vital signs reviewed and stable  Post vital signs: Reviewed and stable  Last Vitals:  Filed Vitals:   07/24/2015 1430  BP: 116/60  Pulse: 54  Temp:   Resp: 21    Complications: No apparent anesthesia complications

## 2015-08-09 NOTE — Progress Notes (Signed)
Initial Nutrition Assessment  DOCUMENTATION CODES:   Not applicable  INTERVENTION:   If pt unable to extubate with 48 hours of intubation, recommend:  Initiate Vital AF 1.2 @ 20 ml/hr via OGT and increase by 10 ml every 4 hours to goal rate of 45 ml/hr.   Tube feeding regimen provides 1296 kcal (>100% of needs), 81 grams of protein, and 876 ml of H2O.    NUTRITION DIAGNOSIS:   Inadequate oral intake related to inability to eat as evidenced by NPO status.  GOAL:   Patient will meet greater than or equal to 90% of their needs  MONITOR:   Vent status, Labs, Weight trends, Skin, I & O's  REASON FOR ASSESSMENT:   Consult Assessment of nutrition requirement/status  ASSESSMENT:   Sabrina Powell is a 69 y.o. female presenting with alcohol withdrawal and malnutrition. PMH is significant for alcohol abuse, elevated liver enzymes, HTN, HLD hypothyroidism, asthma, mood disorder, and anxiety.  Pt admitted with alcohol withdrawal.   Attempted to examine pt x 3 when on medical floor. However, pt was in with RN and MD after experiencing a fall and rapid response/ Code Blue called at times of visits.  Per neuro notes, CT of head revealed large front subdural hematoma. Plan for emergent craniotomy.   Wt hx reviewed, which reveals wt stability > 1 year.   Unable to complete Nutrition-Focused physical exam at this time.   Patient is currently intubated on ventilator support. OGT in place.  MV: 7.8 L/min Temp (24hrs), Avg:98 F (36.7 C), Min:97.6 F (36.4 C), Max:98.3 F (36.8 C)  Propofol: n/a  Labs reviewed.  Diet Order:  Diet NPO time specified  Skin:  Reviewed, no issues  Last BM:  07/17/2015  Height:   Ht Readings from Last 1 Encounters:  08/06/2015  (1.575 m)    Weight:   Wt Readings from Last 1 Encounters:  08/15/2015 118 lb (53.524 kg)    Ideal Body Weight:  50 kg  BMI:  Body mass index is 21.58 kg/(m^2).  Estimated Nutritional Needs:   Kcal:   1167.4  Protein:  80-95 grams  Fluid:  >1.2 L  EDUCATION NEEDS:   No education needs identified at this time  Arsema Tusing A. Mayford Knife, RD, LDN, CDE Pager: [phone] After hours Pager: [phone]

## 2015-08-09 NOTE — Patient Instructions (Signed)
F/u with me on discharge. I will also come see you in the hospital and be in constant contact with the inpatient team.

## 2015-08-09 NOTE — Assessment & Plan Note (Signed)
Multiple lab abnormalities suggesting alcoholic cirrhosis, drinks 1/2-1 pint vodka daily, wants to quit and has been cutting back gradually, Past 3 visits with tremors and tachycardia consistent with withrawal - had a long conversation about options with patient and daughter who is very concerned - will admit to inpatient for detox and work-up of possible cirrhosis

## 2015-08-09 NOTE — Progress Notes (Signed)
pt states she need the hand held neb medication paged on call

## 2015-08-09 NOTE — Anesthesia Preprocedure Evaluation (Addendum)
Anesthesia Evaluation  Patient identified by MRN, date of birth, ID band Patient awake    Reviewed: Unable to perform ROS - Chart review onlyPreop documentation limited or incomplete due to emergent nature of procedure.  Airway   TM Distance: >3 FB Neck ROM: Full    Dental no notable dental hx.    Pulmonary asthma , pneumonia, former smoker,    Pulmonary exam normal breath sounds clear to auscultation       Cardiovascular hypertension, Pt. on medications  Rhythm:Regular Rate:Normal     Neuro/Psych negative neurological ROS  negative psych ROS   GI/Hepatic negative GI ROS, Neg liver ROS,   Endo/Other  negative endocrine ROSHypothyroidism   Renal/GU negative Renal ROS     Musculoskeletal negative musculoskeletal ROS (+)   Abdominal   Peds  Hematology  (+) Blood dyscrasia, anemia ,   Anesthesia Other Findings   Reproductive/Obstetrics negative OB ROS                          Anesthesia Physical Anesthesia Plan  ASA: IV and emergent  Anesthesia Plan: General   Post-op Pain Management:    Induction: Intravenous  Airway Management Planned: Oral ETT  Additional Equipment: Arterial line  Intra-op Plan:   Post-operative Plan: Post-operative intubation/ventilation  Informed Consent: I have reviewed the patients History and Physical, chart, labs and discussed the procedure including the risks, benefits and alternatives for the proposed anesthesia with the patient or authorized representative who has indicated his/her understanding and acceptance.   Dental advisory given  Plan Discussed with: CRNA  Anesthesia Plan Comments:       Anesthesia Quick Evaluation

## 2015-08-10 ENCOUNTER — Encounter (HOSPITAL_COMMUNITY): Payer: Self-pay | Admitting: Neurosurgery

## 2015-08-10 ENCOUNTER — Inpatient Hospital Stay (HOSPITAL_COMMUNITY): Payer: Medicare Other

## 2015-08-10 DIAGNOSIS — R188 Other ascites: Secondary | ICD-10-CM

## 2015-08-10 LAB — CBC WITH DIFFERENTIAL/PLATELET
Basophils Absolute: 0 10*3/uL (ref 0.0–0.1)
Basophils Relative: 0 %
Eosinophils Absolute: 0.2 10*3/uL (ref 0.0–0.7)
Eosinophils Relative: 1 %
HCT: 26.5 % — ABNORMAL LOW (ref 36.0–46.0)
Hemoglobin: 9.2 g/dL — ABNORMAL LOW (ref 12.0–15.0)
Lymphocytes Relative: 7 %
Lymphs Abs: 1.2 10*3/uL (ref 0.7–4.0)
MCH: 31.8 pg (ref 26.0–34.0)
MCHC: 34.7 g/dL (ref 30.0–36.0)
MCV: 91.7 fL (ref 78.0–100.0)
Monocytes Absolute: 1.2 10*3/uL — ABNORMAL HIGH (ref 0.1–1.0)
Monocytes Relative: 6 %
Neutro Abs: 15.6 10*3/uL — ABNORMAL HIGH (ref 1.7–7.7)
Neutrophils Relative %: 86 %
Platelets: 139 10*3/uL — ABNORMAL LOW (ref 150–400)
RBC: 2.89 MIL/uL — ABNORMAL LOW (ref 3.87–5.11)
RDW: 18.1 % — ABNORMAL HIGH (ref 11.5–15.5)
WBC: 18.2 10*3/uL — ABNORMAL HIGH (ref 4.0–10.5)

## 2015-08-10 LAB — PREPARE FRESH FROZEN PLASMA
Unit division: 0
Unit division: 0
Unit division: 0
Unit division: 0

## 2015-08-10 LAB — BASIC METABOLIC PANEL
Anion gap: 8 (ref 5–15)
BUN: 17 mg/dL (ref 6–20)
CO2: 21 mmol/L — ABNORMAL LOW (ref 22–32)
Calcium: 7 mg/dL — ABNORMAL LOW (ref 8.9–10.3)
Chloride: 112 mmol/L — ABNORMAL HIGH (ref 101–111)
Creatinine, Ser: 0.92 mg/dL (ref 0.44–1.00)
GFR calc Af Amer: >60 mL/min (ref >60)
GFR calc non Af Amer: >60 mL/min (ref >60)
Glucose, Bld: 108 mg/dL — ABNORMAL HIGH (ref 65–99)
Potassium: 4 mmol/L (ref 3.5–5.1)
Sodium: 141 mmol/L (ref 135–145)

## 2015-08-10 LAB — URINE CULTURE
Culture: NO GROWTH
Special Requests: NORMAL

## 2015-08-10 LAB — GLUCOSE, CAPILLARY
Glucose-Capillary: 99 mg/dL (ref 65–99)
Glucose-Capillary: 106 mg/dL — ABNORMAL HIGH (ref 65–99)
Glucose-Capillary: 109 mg/dL — ABNORMAL HIGH (ref 65–99)
Glucose-Capillary: 115 mg/dL — ABNORMAL HIGH (ref 65–99)
Glucose-Capillary: 92 mg/dL (ref 65–99)

## 2015-08-10 LAB — PROCALCITONIN: PROCALCITONIN: 1.45 ng/mL

## 2015-08-10 LAB — OCCULT BLOOD X 1 CARD TO LAB, STOOL: Fecal Occult Bld: POSITIVE — AB

## 2015-08-10 LAB — HEPATITIS B SURFACE ANTIGEN: HEP B S AG: NEGATIVE

## 2015-08-10 LAB — HEPATITIS B SURFACE ANTIBODY, QUANTITATIVE

## 2015-08-10 LAB — HEPATITIS B CORE ANTIBODY, TOTAL: HEP B C TOTAL AB: NEGATIVE

## 2015-08-10 MED ORDER — VANCOMYCIN HCL IN DEXTROSE 750-5 MG/150ML-% IV SOLN: 750.0000 mg | Freq: Two times a day (BID) | INTRAVENOUS | Status: DC

## 2015-08-10 MED ORDER — VITAL AF 1.2 CAL PO LIQD
1000.0000 mL | ORAL | Status: DC
  Administered 2015-08-10: 1000 mL
  Filled 2015-08-10 (×4): qty 1000

## 2015-08-10 MED ORDER — VANCOMYCIN HCL 500 MG IV SOLR
500.0000 mg | Freq: Two times a day (BID) | INTRAVENOUS | Status: DC
  Administered 2015-08-11 (×2): 500 mg via INTRAVENOUS
  Filled 2015-08-10 (×3): qty 500

## 2015-08-10 MED ORDER — VANCOMYCIN HCL IN DEXTROSE 1-5 GM/200ML-% IV SOLN
1000.0000 mg | Freq: Once | INTRAVENOUS | Status: AC
  Administered 2015-08-10: 1000 mg via INTRAVENOUS
  Filled 2015-08-10: qty 200

## 2015-08-10 MED ORDER — ACETAMINOPHEN 325 MG PO TABS
650.0000 mg | ORAL_TABLET | Freq: Four times a day (QID) | ORAL | Status: DC | PRN
  Administered 2015-08-10 – 2015-08-11 (×2): 650 mg via ORAL
  Filled 2015-08-10 (×2): qty 2

## 2015-08-10 MED ORDER — SODIUM CHLORIDE 0.9 % IV BOLUS (SEPSIS)
500.0000 mL | Freq: Once | INTRAVENOUS | Status: AC
  Administered 2015-08-10: 500 mL via INTRAVENOUS

## 2015-08-10 MED ORDER — VITAL HIGH PROTEIN PO LIQD: 1000.0000 mL | ORAL | Status: DC

## 2015-08-10 MED ORDER — DEXTROSE 5 % IV SOLN
2.0000 g | Freq: Two times a day (BID) | INTRAVENOUS | Status: DC
  Administered 2015-08-10 – 2015-08-11 (×3): 2 g via INTRAVENOUS
  Filled 2015-08-10 (×4): qty 2

## 2015-08-10 NOTE — Anesthesia Postprocedure Evaluation (Signed)
  Anesthesia Post-op Note  Patient: Sabrina Powell  Procedure(s) Performed: Procedure(s): CRANIOTOMY HEMATOMA EVACUATION SUBDURAL WITH PLACEMENT OF BONE FLAP IN ABDOMEN (N/A)  Patient Location: ICU  Anesthesia Type:General  Level of Consciousness: Patient remains intubated per anesthesia plan  Airway and Oxygen Therapy: Patient remains intubated per anesthesia plan  Post-op Pain: none  Post-op Assessment: Post-op Vital signs reviewed LLE Motor Response: Abnormal flexion (Decorticate) (some tri-flex)   RLE Motor Response: Abnormal flexion (Decorticate) (some tri-flex)        Post-op Vital Signs: Reviewed  Last Vitals:  Filed Vitals:   08/10/15 1920  BP:   Pulse:   Temp: 37.4 C  Resp:     Complications: No apparent anesthesia complications

## 2015-08-10 NOTE — Clinical Documentation Improvement (Addendum)
[facility],  Query 1 of 2  To assist with accurate code assignment, please document the Type of Herniation documented in the operative note dated 07/24/2015 and in the progress note dated 08/11/15:   - Brain Herniation  - Cerebral Herniation  - Other type of herniation  - Unable to clinically determine  Clinical Information: Dictated op note dated 08/07/2015 - "POST-OPERATIVE DIAGNOSIS: Acute right hemispheric subdural hemotoma with mass effect, midline shift and herniation"  Please see Dr. Earl Gala progress note dated 08/11/15   Query 2 of 2  Possible Clinical Conditions:  - Acute Blood Loss Anemia  - Other condition  - Unable to clinically determine  Clinical information: Lowest H&H postop - 5.8/17.5  (07/19/2015) Patient has received 4 units of PRBCs this admission  (Please document your response in the progress notes and not on the query form itself.)   Please exercise your independent, professional judgment when responding. A specific answer is not anticipated or expected.   Thank You, Jerral Ralph [facility] [place] [phone]

## 2015-08-10 NOTE — Progress Notes (Signed)
eLink Physician-Brief Progress Note Patient Name: Sabrina Powell DOB: 19-Jun-1946 MRN: [id number]   Date of Service  08/10/2015  HPI/Events of Note  oliguria  eICU Interventions  Bolus 500 cc NS.      Intervention Category Intermediate Interventions: Oliguria - evaluation and management  Shane Crutch 08/10/2015, 12:46 AM

## 2015-08-10 NOTE — Progress Notes (Signed)
PULMONARY / CRITICAL CARE MEDICINE   Name: Sabrina Powell MRN: [id number] DOB: 05/02/1946    ADMISSION DATE:  2015/09/03 CONSULTATION DATE:  07/22/2015  REFERRING MD :  Lenise Herald  CHIEF COMPLAINT:  AMS  INITIAL PRESENTATION: 69 year old female with PMH ETOH abuse admitted to [facility] 9/27 s/p fall. At time of admission she had no neurological deficits, however 9/28 she was found lying on floor next to bed with decreased LOC, which quickly deteriorated to agonal respirations requiring intubation. Patient was sent for head CT and PCCM consulted for ICU transfer.   STUDIES:  CT head 9/28 > Large R acute on chronic SDH with midline shift 9/28 Abd [place] > Increased and coarse echogenicity of hepatic parenchyma is noted suggesting fatty infiltration or other diffuse hepatocellular disease such as cirrhosis. Ascites  SIGNIFICANT EVENTS: 9/27 admit for ETOH and malnutrition 9/28 fall, decreased LOC and new neuro deficits, intubated > ICU  HISTORY OF PRESENT ILLNESS:  69 year old female with PMH as below, which includes ETOH abuse, HTN, HLD, Hypothyroidism, and depression. About 3 years ago there was an event in her family that caused her to become depressed. This is when her regular drinking of alcohol began, however, recently she lost her job and started drinking even more. She was up to about a half pint of vodka daily. According to recent notes from her PCP [facility]) she has been trying to cut down on this, and had been exhibiting signs of withdrawal such as tremors. LFTs have also been elevated concerning for the development of alcoholic cirrhosis. She has also been falling more frequently of late. She thought this was because of Lexapro, so she stopped taking it.   9/27 she presented to ED with complaints of fatigue, tremors, and loss of appetite. She was admitted to [facility] for Liver disease and ETOH withdrawal.  9/28 AM she was in her USOH on rounds, however, shortly after she was found down at the  side of the bed with bruising to face. She was placed back into bed and was noted to be sleepier than she had been. Upon rapid response arrival the patient was minimally responsive and a code was called. She was intubated and taken for head CT. Then to ICU. PCCM to assume care. Neurosurgery to take to OR.   SUBJECTIVE:   VITAL SIGNS: Temp:  [94.3 F (34.6 C)-101.3 F (38.5 C)] 99.7 F (37.6 C) (09/29 0800) Pulse Rate:  [54-94] 85 (09/29 1100) Resp:  [14-33] 20 (09/29 1100) BP: (87-147)/(42-71) 111/67 mmHg (09/29 1100) SpO2:  [95 %-100 %] 100 % (09/29 1100) Arterial Line BP: (109-155)/(45-71) 140/60 mmHg (09/29 1100) FiO2 (%):  [40 %-100 %] 40 % (09/29 0800) HEMODYNAMICS:   VENTILATOR SETTINGS: Vent Mode:  [-] PRVC FiO2 (%):  [40 %-100 %] 40 % Set Rate:  [14 bmp] 14 bmp Vt Set:  [400 mL] 400 mL PEEP:  [5 cmH20] 5 cmH20 Plateau Pressure:  [19 cmH20-26 cmH20] 21 cmH20 INTAKE / OUTPUT:  Intake/Output Summary (Last 24 hours) at 08/10/15 1159 Last data filed at 08/10/15 1100  Gross per 24 hour  Intake 7589.09 ml  Output   1285 ml  Net 6304.09 ml    PHYSICAL EXAMINATION: General:  Female in NAD on vent Neuro:  Completely unresponsive. Only decerebrate posturing to pain. Pupils unreactive  HEENT:  Ecchymosis to R frontotemporal area Cardiovascular:  Bradycardic, regular Lungs:  Clear bilateral breath sounds Abdomen:  Distended, fluctuant Musculoskeletal:  No acute deformity Skin:  Grossly  intact  LABS:  CBC  Recent Labs Lab Aug 24, 2015 0528 Aug 24, 2015 2020 08/10/15 0400  WBC 18.3* 13.6* 18.2*  HGB 8.2* 5.8* 9.2*  HCT 25.4* 17.5* 26.5*  PLT 297 138* 139*   Coag's  Recent Labs Lab 07/30/2015 1720 08/24/15 2135  APTT  --  49*  INR 1.33 1.43   BMET  Recent Labs Lab August 24, 2015 0528 08-24-15 1922 08/10/15 0400  NA 134* 135 141  K 4.1 3.7 4.0  CL 103 105 112*  CO2 21* 23 21*  BUN CREATININE 0.98 0.81 0.92  GLUCOSE 173* 217* 108*    Electrolytes  Recent Labs Lab 07/19/2015 1720 24-Aug-2015 0528 08/24/15 1922 08/10/15 0400  CALCIUM 7.3* 7.8* 6.8* 7.0*  MG 1.7  --   --   --   PHOS 2.9  --   --   --    Sepsis Markers  Recent Labs Lab 24-Aug-2015 1355 24-Aug-2015 1922 08/10/15 0400  LATICACIDVEN 2.2*  --   --   PROCALCITON  --  1.24 1.45   ABG  Recent Labs Lab 24-Aug-2015 1838  PHART 7.344*  PCO2ART 39.1  PO2ART 460*   Liver Enzymes  Recent Labs Lab 08/11/2015 1720  AST 196*  ALT 46  ALKPHOS 537*  BILITOT 3.7*  ALBUMIN 1.8*   Cardiac Enzymes No results for input(s): TROPONINI, PROBNP in the last 168 hours. Glucose  Recent Labs Lab 07/16/2015 1813 24-Aug-2015 1323 24-Aug-2015 1957 08-24-2015 2318 08/10/15 0324 08/10/15 0827  GLUCAP 137* 156* 194* 107* 99 106*    Imaging Ct Head Wo Contrast  08/10/2015   CLINICAL DATA:  Subdural hematoma.  Status post craniotomy.  EXAM: CT HEAD WITHOUT CONTRAST  TECHNIQUE: Contiguous axial images were obtained from the base of the skull through the vertex without intravenous contrast.  COMPARISON:  CT head without contrast August 24, 2015.  FINDINGS: A right-sided craniectomy is noted. There is a drain in the extra-axial space on the right. There is significant reduction in the extent of the previously noted subdural hemorrhage. 7 mm of midline shift remains at the foramen of Monro compared with 14 mm on the prior exam.  There is interval development of significant hypoattenuation involving the left occipital lobe compatible with developing infarct. A larger infarct territory is present on the right involving the posterior right temporal lobe, right occipital lobe, and portions of the right parietal lobe extending to the vertex. There is continued mass effect and effacement of the sulci over the right hemisphere. Partial effacement of the right lateral ventricle, particular posteriorly is again noted.  A much smaller volume extra-axial hemorrhage on the left is again seen without  significant interval change. Fourth ventricle volume is improved. The basal cisterns are better visualized on today's study.  Chronic cerebellar atrophy is again seen.  No new areas of hemorrhage are evident.  IMPRESSION: 1. Right frontoparietal craniectomy with significant decompression of the prominent right extra-axial hemorrhage. 2. Reduction in mass effect with decrease in midline shift from 14 to 7 mm. 3. Persistent effacement of the sulci on the right and effacement of the posterior right lateral ventricle. 4. Interval development of infarcts involving the left occipital lobe and more extensively on the right with involvement of the right occipital lobe, posterior right temporal lobe, and medial right parietal lobe.   Electronically Signed   By: Marin Roberts M.D.   On: 08/10/2015 08:34   Ct Head Wo Contrast  2015-08-24   ADDENDUM REPORT: 2015/08/24 15:11 ADDENDUM: Critical Value/emergent results were  called by telephone at the time of interpretation on 07/29/2015 at 2:20pm to Dr. Beverely Low, MD , who verbally acknowledged these results. Electronically Signed   By: Charlett Nose M.D.   On: 07/18/2015 15:11  08/01/2015   CLINICAL DATA:  Fall, on anticoagulation. Altered mental status since fall. Right forehead hematoma.  EXAM: CT HEAD WITHOUT CONTRAST  TECHNIQUE: Contiguous axial images were obtained from the base of the skull through the vertex without intravenous contrast.  COMPARISON:  None.  FINDINGS: There is a large right mixed density subdural hematoma. This could reflect acute on chronic subdural hemorrhage. This measures approximately 18 mm in thickness. There is right to left midline shift of 16 mm. No intraparenchymal hemorrhage. No hydrocephalus. No acute calvarial abnormality.  Visualized paranasal sinuses and mastoids clear. Orbital soft tissues unremarkable.  IMPRESSION: Large right mixed density subdural hematoma, likely acute on chronic measuring up to 18 mm with 16 mm of right to  left midline shift.  Electronically Signed: By: Charlett Nose M.D. On: 08/03/2015 14:14   Portable Chest Xray  07/21/2015   CLINICAL DATA:  Endotracheal tube placement  EXAM: PORTABLE CHEST 1 VIEW  COMPARISON:  02/13/2011  FINDINGS: Endotracheal tube with the tip 2.8 cm above the carina. Nasogastric tube coursing below the diaphragm.  There is no focal parenchymal opacity. There is no pleural effusion or pneumothorax. The heart and mediastinal contours are unremarkable.  The osseous structures are unremarkable.  IMPRESSION: Endotracheal tube with the tip 2.8 cm above the carina.   Electronically Signed   By: Elige Ko   On: 07/26/2015 14:59   Dg Abd Portable 1v  07/31/2015   CLINICAL DATA:  Evaluate orogastric tube  EXAM: PORTABLE ABDOMEN - 1 VIEW  COMPARISON:  None.  FINDINGS: Orogastric tube tip at the distal stomach.  Visualized bowel gas pattern is nonobstructive.  Probable small left pleural effusion.  IMPRESSION: Orogastric tube tip at the distal stomach.   Electronically Signed   By: Marnee Spring M.D.   On: 08/07/2015 21:45     ASSESSMENT / PLAN:  PULMONARY OETT 9/28 >  A: VDRF in setting of subdural hematoma  P:   Full vent support, assess for SBT daily once mental status is improved. Hold SBT for today given post op status and mental status. ABG CXR for ETT placement now and in AM. VAP bundle  CARDIOVASCULAR A:  Bradycardia, likely in setting ICP change H/o HTN, HLD  P:  Tele MAP goal >67mm/Hg Dc home lasix, spironolactone, coreg, ASA  RENAL A:   Lactic acidosis Mild hyponatremia  P:   Repeat lactic to ensure clearing Follow Bmet  GASTROINTESTINAL A:   Suspect alcoholic cirrhosis Possible GI bleed - melena noted Concern varices  P:   Hemoccult  Protonix bolus, gtt Consider octreotide with cirrhosis and risk varices May need GI consult but will defer til neuro status is more stable. Transfuse as below  HEMATOLOGIC A:   Anemia chronic  illness Consider acute blood loss superimposed  P:  D/c lovenox SCDs Trend CBC, Coags  INFECTIOUS A:   Leukocytosis - suspect reactive  P:   BCx2 9/28 >>> UC 9/28 >>>  Cefepime 9/29>>> Vancomycin 9/29>>>  Surgical prophylaxis if indicated per neurosurgery PCT 1.45 and WBC rising, fever overnight to 101.3 (could be post op fever but PCT is elevated), will start vanc/cefepime.  ENDOCRINE A:   Hyperglycemia without history of DM Hypothyroid   P:   Transition synthroid to IV dose TSH CBG monitoring  and SSI  NEUROLOGIC A:   Acute on chronic subdural hematoma ETOH withdrawal ? Seizures  P:   Decompression under Neurosurgery direction RASS goal: 0 PRN fentanyl for analgesia PRN ativan for seizures, withdrawal associated agitation only Minimize sedating medications as much as possible.  Keppra  BID  FAMILY  - Updates: No family bedside.  - Inter-disciplinary family meet or Palliative Care meeting due by:  8/5  The patient is critically ill with multiple organ systems failure and requires high complexity decision making for assessment and support, frequent evaluation and titration of therapies, application of advanced monitoring technologies and extensive interpretation of multiple databases.   Critical Care Time devoted to patient care services described in this note is  35  Minutes. This time reflects time of care of this signee Dr Koren Bound. This critical care time does not reflect procedure time, or teaching time or supervisory time of PA/NP/Med student/Med Resident etc but could involve care discussion time.  Alyson Reedy, M.D. [facility] Pulmonary/Critical Care Medicine. Pager: [phone]. After hours pager: [phone].

## 2015-08-10 NOTE — Progress Notes (Addendum)
Nutrition Follow-up  DOCUMENTATION CODES:   Non-severe (moderate) malnutrition in context of social or environmental circumstances  INTERVENTION:   Initiate Vital AF 1.2 @ 20 ml/hr via OGT and increase by 10 ml every 4 hours to goal rate of 55 ml/hr.   Tube feeding regimen provides 1584 kcal (100% of needs), 99 grams of protein, and 1071 ml of H2O.   NUTRITION DIAGNOSIS:   Inadequate oral intake related to inability to eat as evidenced by NPO status.  Ongoing  GOAL:   Patient will meet greater than or equal to 90% of their needs  Unmet  MONITOR:   Vent status, Labs, Weight trends, TF tolerance, Skin, I & O's  REASON FOR ASSESSMENT:   Consult Assessment of nutrition requirement/status  ASSESSMENT:   Sabrina Powell is a 69 y.o. female presenting with alcohol withdrawal and malnutrition. PMH is significant for alcohol abuse, elevated liver enzymes, HTN, HLD hypothyroidism, asthma, mood disorder, and anxiety.  Pt s/p Procedure(s) on 07/28/2015:  Right frontoparietal craniectomy and evacuation of acute subdural hematoma with duraplasty, and with placement of the bone flap in the right abdominal wall   Patient is currently intubated on ventilator support. OGT in place. MV: 8.0 L/min Temp (24hrs), Avg:98.8 F (37.1 C), Min:94.3 F (34.6 C), Max:101.3 F (38.5 C)  Propofol: n/a   No family at bedside to provide hx. Per CCM notes, pt started regularly consuming alcohol about 3 years ago, due to depression from a family event; consumption increased when pt lost her job recently and was consuming a half pint of vodka PTA. Pt was trying to decrease consumption, but began experiencing withdrawal symptoms.   Per MD notes, pt with suspected alcoholic cirrhosis and possible GI bleed. Noted abdominal distention on nutrition-focused physical exam.   Nutrition-Focused physical exam completed. Findings are mild fat depletion, mild to moderate muscle depletion, and mild edema.   Labs  reviewed.   Diet Order:  Diet NPO time specified  Skin:  Reviewed, no issues  Last BM:  07/16/2015  Height:   Ht Readings from Last 1 Encounters:  08/05/2015  (1.575 m)    Weight:   Wt Readings from Last 1 Encounters:  09-06-15 118 lb (53.524 kg)    Ideal Body Weight:  50 kg  BMI:  Body mass index is 21.58 kg/(m^2).  Estimated Nutritional Needs:   Kcal:  1547.5  Protein:  85-100 grams  Fluid:  >1.5 L  EDUCATION NEEDS:   No education needs identified at this time  Jenifer A. Mayford Knife, RD, LDN, CDE Pager: [phone] After hours Pager: [phone]

## 2015-08-10 NOTE — Progress Notes (Signed)
Subjective: Patient continues on ventilator via ETT in 36M ICU. Drop in hemoglobin treated by CCM with additional 2 units PRBC. Not requiring sedation. Drainage into Jackson-Pratt drain diminishing.  Objective: Vital signs in last 24 hours: Filed Vitals:   08/10/15 0430 08/10/15 0500 08/10/15 0530 08/10/15 0600  BP: 106/45 117/52 107/55 115/55  Pulse: 90 89 90 87  Temp:      TempSrc:      Resp: Height:      Weight:      SpO2: 100% 100% 99% 100%    Intake/Output from previous day: 09/28 0701 - 09/29 0700 In: 6969.8 [I.V.:2925; Blood:2584.8; IV Piggyback:1460] Out: 1290 [Urine:455; Drains:335; Blood:500] Intake/Output this shift: Total I/O In: 2228.8 [I.V.:825; Blood:693.8; IV Piggyback:710] Out: 330 [Urine:155; Drains:175]  Physical Exam:  Not opening eyes to voice or pain. Pupils 3.5 mm bilaterally, round, weakly reactive to light. Roving eye movements from just to the left of midline, to the far right. Decerebrate posturing spontaneously as well as to painful stimulus. Head dressing reinforced slightly by nursing staff because of mild shadowing. Abdominal dressing clean and dry.  CBC  Recent Labs  07/19/2015 2020 08/10/15 0400  WBC 13.6* 18.2*  HGB 5.8* 9.2*  HCT 17.5* 26.5*  PLT 138* 139*   BMET  Recent Labs  08/10/2015 1922 08/10/15 0400  NA 135 141  K 3.7 4.0  CL 105 112*  CO2 23 21*  GLUCOSE 217* 108*  BUN 16 17  CREATININE 0.81 0.92  CALCIUM 6.8* 7.0*    Assessment/Plan: Patient currently hemodynamically stable, but remains in coma. Only slight neurologic improvement overnight is that the pupils show weak reaction to light. Scheduled for CT scan of brain this morning.  Patient remains critically ill, with very guarded prognosis.   Hewitt Shorts, MD 08/10/2015, 6:22 AM

## 2015-08-10 NOTE — Progress Notes (Signed)
CRITICAL VALUE ALERT  Critical value received:  Hbg 5.8  Date of notification:  07/31/2015  Time of notification:  2100  Critical value read back:Yes.    Nurse who received alert:  Everette Rank, RN  MD notified (1st page):  Dr. Arsenio Loader  Time of first page:  2100  MD notified (2nd page):  Time of second page:  Responding MD:  Dr. Arsenio Loader  Time MD responded:  2110  New orders received to transfuse PRBC.

## 2015-08-10 NOTE — Progress Notes (Signed)
ANTIBIOTIC CONSULT NOTE - INITIAL  Pharmacy Consult for vancomycin, ceftazidime Indication: rule out sepsis  Allergies  Allergen Reactions  . Amlodipine Besylate     REACTION: LE edema    Patient Measurements: Height:  (157.5 cm) Weight: 118 lb (53.524 kg) IBW/kg (Calculated) : 50.1 Adjusted Body Weight:   Vital Signs: Temp: 99.7 F (37.6 C) (09/29 0800) Temp Source: Axillary (09/29 0800) BP: 111/67 mmHg (09/29 1100) Pulse Rate: 85 (09/29 1100) Intake/Output from previous day: 09/28 0701 - 09/29 0700 In: 7286.1 [I.V.:3241.3; Blood:2584.8; IV Piggyback:1460] Out: 1540 [Urine:470; Emesis/NG output:225; Drains:345; Blood:500] Intake/Output from this shift: Total I/O In: 803 [I.V.:703; IV Piggyback:100] Out: 45 [Urine:45]  Labs:  Recent Labs  08/30/15 0528 2015-08-30 1922 2015-08-30 2020 08/10/15 0400  WBC 18.3*  --  13.6* 18.2*  HGB 8.2*  --  5.8* 9.2*  PLT 297  --  138* 139*  CREATININE 0.98 0.81  --  0.92   Estimated Creatinine Clearance: 47.6 mL/min (by C-G formula based on Cr of 0.92). No results for input(s): VANCOTROUGH, VANCOPEAK, VANCORANDOM, GENTTROUGH, GENTPEAK, GENTRANDOM, TOBRATROUGH, TOBRAPEAK, TOBRARND, AMIKACINPEAK, AMIKACINTROU, AMIKACIN in the last 72 hours.   Microbiology: Recent Results (from the past 720 hour(s))  Culture, blood (routine x 2)     Status: None (Preliminary result)   Collection Time: 08-30-2015  5:28 AM  Result Value Ref Range Status   Specimen Description BLOOD RIGHT ANTECUBITAL  Final   Special Requests BOTTLES DRAWN AEROBIC AND ANAEROBIC 5CC  Final   Culture PENDING  Incomplete   Report Status PENDING  Incomplete  Culture, blood (routine x 2)     Status: None (Preliminary result)   Collection Time: 08/30/15  5:34 AM  Result Value Ref Range Status   Specimen Description BLOOD LEFT ANTECUBITAL  Final   Special Requests BOTTLES DRAWN AEROBIC AND ANAEROBIC 10 CC  Final   Culture PENDING  Incomplete   Report Status PENDING   Incomplete  MRSA PCR Screening     Status: None   Collection Time: 08-30-15  2:35 PM  Result Value Ref Range Status   MRSA by PCR NEGATIVE NEGATIVE Final    Comment:        The GeneXpert MRSA Assay (FDA approved for NASAL specimens only), is one component of a comprehensive MRSA colonization surveillance program. It is not intended to diagnose MRSA infection nor to guide or monitor treatment for MRSA infections.     Medical History: Past Medical History  Diagnosis Date  . HTN (hypertension)   . HLD (hyperlipidemia)   . Overweight(278.02)   . Allergic rhinitis   . Asthma, mild persistent   . IDA (iron deficiency anemia)   . Pneumonia 1994  . Hypothyroidism   . Depression   . Anxiety   . Alcoholism /alcohol abuse     one prior episode of DTs     Medications:  Anti-infectives    Start     Dose/Rate Route Frequency Ordered Stop   08/10/15 1245  vancomycin (VANCOCIN) IVPB 1000 mg/200 mL premix     1,000 mg 200 mL/hr over 60 Minutes Intravenous  Once 08/10/15 1213     08/10/15 1245  cefTAZidime (FORTAZ) 2 g in dextrose 5 % 50 mL IVPB     2 g 100 mL/hr over 30 Minutes Intravenous Every 12 hours 08/10/15 1213     2015/08/30 1555  bacitracin 50,000 Units in sodium chloride irrigation 0.9 % 500 mL irrigation  Status:  Discontinued       As  needed 07/26/2015 1556 08/06/2015 1814   07/15/2015 1500  cefTRIAXone (ROCEPHIN) 2 g in dextrose 5 % 50 mL IVPB     2 g 100 mL/hr over 30 Minutes Intravenous To Neuro OR-Station #32 07/23/2015 1459 07/18/2015 1520     Assessment: 69 yo female admitted with fatigue, tremors, lost of appetite, has hx of EtOH abuse.  Initiating abx empirically due to leukocytosis, fevers, tmax/24h 101.3, LA 2.2. Blood and urine cultures drawn yesterday remain ngtd.    Goal of Therapy:  Vancomycin trough level 15-20 mcg/ml   Plan:  -Ceftazidime 2 g IV q12h -Vancomycin 1 g IV x1 then 500/12h -Monitor renal fx, cultures, VT at Css as needed    Agapito Games,  PharmD, BCPS Clinical Pharmacist Pager: [phone] 08/10/2015 12:14 PM

## 2015-08-11 ENCOUNTER — Inpatient Hospital Stay (HOSPITAL_COMMUNITY): Payer: Medicare Other

## 2015-08-11 DIAGNOSIS — K7030 Alcoholic cirrhosis of liver without ascites: Secondary | ICD-10-CM

## 2015-08-11 DIAGNOSIS — R69 Illness, unspecified: Secondary | ICD-10-CM

## 2015-08-11 DIAGNOSIS — Z515 Encounter for palliative care: Secondary | ICD-10-CM | POA: Insufficient documentation

## 2015-08-11 LAB — BASIC METABOLIC PANEL
Anion gap: 9 (ref 5–15)
BUN: 16 mg/dL (ref 6–20)
CALCIUM: 6.4 mg/dL — AB (ref 8.9–10.3)
CO2: 17 mmol/L — ABNORMAL LOW (ref 22–32)
CREATININE: 1.13 mg/dL — AB (ref 0.44–1.00)
Chloride: 116 mmol/L — ABNORMAL HIGH (ref 101–111)
GFR calc Af Amer: 57 mL/min — ABNORMAL LOW (ref >60)
GFR, EST NON AFRICAN AMERICAN: 50 mL/min — AB (ref >60)
GLUCOSE: 113 mg/dL — AB (ref 65–99)
POTASSIUM: 3.6 mmol/L (ref 3.5–5.1)
SODIUM: 142 mmol/L (ref 135–145)

## 2015-08-11 LAB — BLOOD GAS, ARTERIAL
Acid-base deficit: 6.5 mmol/L — ABNORMAL HIGH (ref 0.0–2.0)
Bicarbonate: 17 mEq/L — ABNORMAL LOW (ref 20.0–24.0)
DRAWN BY: 437071
FIO2: 0.3
O2 SAT: 98.1 %
PATIENT TEMPERATURE: 101.4
PCO2 ART: 28.4 mmHg — AB (ref 35.0–45.0)
PEEP: 5 cmH2O
PH ART: 7.403 (ref 7.350–7.450)
PO2 ART: 111 mmHg — AB (ref 80.0–100.0)
RATE: 14 resp/min
TCO2: 17.8 mmol/L (ref 0–100)
VT: 400 mL

## 2015-08-11 LAB — MAGNESIUM: Magnesium: 1.4 mg/dL — ABNORMAL LOW (ref 1.7–2.4)

## 2015-08-11 LAB — CBC
HCT: 27.4 % — ABNORMAL LOW (ref 36.0–46.0)
Hemoglobin: 9.4 g/dL — ABNORMAL LOW (ref 12.0–15.0)
MCH: 32.1 pg (ref 26.0–34.0)
MCHC: 34.3 g/dL (ref 30.0–36.0)
MCV: 93.5 fL (ref 78.0–100.0)
PLATELETS: 178 10*3/uL (ref 150–400)
RBC: 2.93 MIL/uL — AB (ref 3.87–5.11)
RDW: 20.9 % — AB (ref 11.5–15.5)
WBC: 33.7 10*3/uL — ABNORMAL HIGH (ref 4.0–10.5)

## 2015-08-11 LAB — PROCALCITONIN: Procalcitonin: 1.83 ng/mL

## 2015-08-11 LAB — GLUCOSE, CAPILLARY
GLUCOSE-CAPILLARY: 112 mg/dL — AB (ref 65–99)
GLUCOSE-CAPILLARY: 122 mg/dL — AB (ref 65–99)
Glucose-Capillary: 102 mg/dL — ABNORMAL HIGH (ref 65–99)
Glucose-Capillary: 130 mg/dL — ABNORMAL HIGH (ref 65–99)
Glucose-Capillary: 188 mg/dL — ABNORMAL HIGH (ref 65–99)

## 2015-08-11 LAB — PHOSPHORUS: PHOSPHORUS: 2.5 mg/dL (ref 2.5–4.6)

## 2015-08-11 MED ORDER — DEXMEDETOMIDINE HCL IN NACL 200 MCG/50ML IV SOLN
0.4000 ug/kg/h | INTRAVENOUS | Status: DC
  Administered 2015-08-11 (×2): 0.4 ug/kg/h via INTRAVENOUS
  Filled 2015-08-11 (×2): qty 50

## 2015-08-11 MED ORDER — SODIUM CHLORIDE 0.9 % IV SOLN
1.0000 mg/h | INTRAVENOUS | Status: DC
  Administered 2015-08-11: 1 mg/h via INTRAVENOUS
  Filled 2015-08-11: qty 5

## 2015-08-11 MED ORDER — GLYCOPYRROLATE 0.2 MG/ML IJ SOLN: 0.2000 mg | INTRAMUSCULAR | Status: DC | PRN

## 2015-08-11 MED ORDER — LORAZEPAM 2 MG/ML IJ SOLN
2.0000 mg | Freq: Four times a day (QID) | INTRAMUSCULAR | Status: DC
  Administered 2015-08-12: 2 mg via INTRAVENOUS
  Filled 2015-08-11: qty 1

## 2015-08-11 MED ORDER — GLYCOPYRROLATE 0.2 MG/ML IJ SOLN: 0.2000 mg | INTRAMUSCULAR | Status: DC

## 2015-08-11 MED ORDER — LACTULOSE 10 GM/15ML PO SOLN
30.0000 g | Freq: Three times a day (TID) | ORAL | Status: DC
  Administered 2015-08-11: 30 g via ORAL
  Filled 2015-08-11: qty 45

## 2015-08-11 MED ORDER — FENTANYL CITRATE (PF) 100 MCG/2ML IJ SOLN: 50.0000 ug | INTRAMUSCULAR | Status: DC | PRN

## 2015-08-11 MED FILL — Medication: Qty: 1 | Status: AC

## 2015-08-11 NOTE — Progress Notes (Signed)
PULMONARY / CRITICAL CARE MEDICINE   Name: Lamaya Hyneman MRN: [id number] DOB: Dec 30, 1945    ADMISSION DATE:  08/07/2015 CONSULTATION DATE:  08/04/2015  REFERRING MD :  Lenise Herald  CHIEF COMPLAINT:  AMS  INITIAL PRESENTATION: 69 year old female with PMH ETOH abuse admitted to [facility] 9/27 s/p fall. At time of admission she had no neurological deficits, however 9/28 she was found lying on floor next to bed with decreased LOC, which quickly deteriorated to agonal respirations requiring intubation. Patient was sent for head CT and PCCM consulted for ICU transfer.   STUDIES:  CT head 9/28 > Large R acute on chronic SDH with midline shift 9/28 Abd [place] > Increased and coarse echogenicity of hepatic parenchyma is noted suggesting fatty infiltration or other diffuse hepatocellular disease such as cirrhosis. Ascites  SIGNIFICANT EVENTS: 9/27 admit for ETOH and malnutrition 9/28 fall, decreased LOC and new neuro deficits, intubated > ICU 9/30 CT head> R craniectomy, reduction in midline shift, interval development of infarcts left occipital lobe, R occipital, temporal, and parietal lobe  SUBJECTIVE: oliguric, no purposeful movements observed overnight  VITAL SIGNS: Temp:  [99.3 F (37.4 C)-101.4 F (38.6 C)] 99.3 F (37.4 C) (09/30 0805) Pulse Rate:  [80-97] 94 (09/30 0805) Resp:  [14-24] 20 (09/30 0805) BP: (102-165)/(51-71) 107/56 mmHg (09/30 0800) SpO2:  [97 %-100 %] 98 % (09/30 0805) Arterial Line BP: (115-265)/(50-263) 139/57 mmHg (09/30 0800) FiO2 (%):  [30 %-40 %] 30 % (09/30 0805) Weight:  [63.5 kg (139 lb 15.9 oz)] 63.5 kg (139 lb 15.9 oz) (09/30 0432) HEMODYNAMICS:   VENTILATOR SETTINGS: Vent Mode:  [-] PRVC FiO2 (%):  [30 %-40 %] 30 % Set Rate:  [14 bmp] 14 bmp Vt Set:  [400 mL] 400 mL PEEP:  [5 cmH20] 5 cmH20 Plateau Pressure:  [15 cmH20-33 cmH20] 25 cmH20 INTAKE / OUTPUT:  Intake/Output Summary (Last 24 hours) at 08/11/15 1012 Last data filed at 08/11/15 0800  Gross  per 24 hour  Intake   4750 ml  Output    245 ml  Net   4505 ml    PHYSICAL EXAMINATION: General:  Female in NAD on vent Neuro:  Extensor response to pain feet, no eye opening  HEENT:  Scalp wound well dressed Cardiovascular:  RRR, no mgr Lungs:  CTA B, vent supported breaths Abdomen:  BS+, soft Musculoskeletal:  No acute deformity Skin:  No rash or breakdown  LABS:  CBC  Recent Labs Lab 08/10/2015 2020 08/10/15 0400 08/11/15 0600  WBC 13.6* 18.2* 33.7*  HGB 5.8* 9.2* 9.4*  HCT 17.5* 26.5* 27.4*  PLT 138* 139* 178   Coag's  Recent Labs Lab 07/21/2015 1720 07/28/2015 2135  APTT  --  49*  INR 1.33 1.43   BMET  Recent Labs Lab 07/13/2015 1922 08/10/15 0400 08/11/15 0600  NA 135 141 142  K 3.7 4.0 3.6  CL 105 112* 116*  CO2 23 21* 17*  BUN CREATININE 0.81 0.92 1.13*  GLUCOSE 217* 108* 113*   Electrolytes  Recent Labs Lab 07/31/2015 1720  07/16/2015 1922 08/10/15 0400 08/11/15 0600  CALCIUM 7.3*  < > 6.8* 7.0* 6.4*  MG 1.7  --   --   --  1.4*  PHOS 2.9  --   --   --  2.5  < > = values in this interval not displayed. Sepsis Markers  Recent Labs Lab 07/31/2015 1355 07/30/2015 1922 08/10/15 0400 08/11/15 0600  LATICACIDVEN 2.2*  --   --   --  PROCALCITON  --  1.24 1.45 1.83   ABG  Recent Labs Lab 07/19/2015 1838 08/11/15 0425  PHART 7.344* 7.403  PCO2ART 39.1 28.4*  PO2ART 460* 111*   Liver Enzymes  Recent Labs Lab August 26, 2015 1720  AST 196*  ALT 46  ALKPHOS 537*  BILITOT 3.7*  ALBUMIN 1.8*   Cardiac Enzymes No results for input(s): TROPONINI, PROBNP in the last 168 hours. Glucose  Recent Labs Lab 08/10/15 1208 08/10/15 1557 08/10/15 1918 08/10/15 2332 08/11/15 0338 08/11/15 0803  GLUCAP 109* 115* 92 102* 112* 122*    Imaging Dg Chest Port 1 View  08/11/2015   CLINICAL DATA:  Intubated patient acute respiratory failure with hypoxia, subdural hematoma with acute encephalopathy.  EXAM: PORTABLE CHEST 1 VIEW  COMPARISON:   Portable chest x-ray of August 10, 2015  FINDINGS: The endotracheal tube tip lies 2 cm above the carina. The esophagogastric tube tip projects below the inferior margin of the image. The lungs are mildly hypoinflated. There is slightly increased interstitial density at the left lung base. There is no pleural effusion. The heart and pulmonary vascularity are normal.  IMPRESSION: Intubated patient, mild hypoinflation with early subsegmental atelectasis in the left lower lobe.   Electronically Signed   By: David  [place] M.D.   On: 08/11/2015 07:33   Dg Chest Port 1 View  08/10/2015   CLINICAL DATA:  ETT  EXAM: PORTABLE CHEST 1 VIEW  COMPARISON:  08/02/2015  FINDINGS: Endotracheal tube terminates 2.5 cm above the carina.  Mild left basilar opacity, likely atelectasis. Associated small left pleural effusion. No pneumothorax.  The heart is normal in size.  IMPRESSION: Endotracheal tube terminates 2.5 cm above the carina.  Mild left basilar opacity, likely atelectasis.  Small left pleural effusion.   Electronically Signed   By: Charline Bills M.D.   On: 08/10/2015 13:11     ASSESSMENT / PLAN:  PULMONARY OETT 9/28 >  A: VDRF in setting of subdural hematoma  P:   Full vent support, mental status limits extubation VAP prevention bundle  CARDIOVASCULAR A:  Bradycardia> resolved H/o HTN, HLD  P:  Tele MAP goal >78mm/Hg Dc home lasix, spironolactone, coreg, ASA  RENAL A:   Lactic acidosis Mild hyponatremia AKI P:   Doubt hemodialysis appropriate given poor neuroprognosis Continue fluids Monitor BMET and UOP Replace electrolytes as needed   GASTROINTESTINAL A:   Suspect alcoholic cirrhosis Hepatic encephalopathy Possible GI bleed - melena noted, no brisk bleeding, hemodynamically stable Concern varices  P:   Continue Protonix gtt Hold off on GI consult Transfuse as below Start lactulose  HEMATOLOGIC A:   Anemia chronic illness Consider acute blood loss  superimposed  P:  D/c lovenox SCDs Trend CBC, Coags  INFECTIOUS A:   Fever and leukocytosis > reactive? 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  Keppra  BID  FAMILY  - Updates: daughter updated at length today,  I expressed to her my concern about her poor prognosis and explained the findings from her CT head and her multi-organ failure.  She voiced understanding.  I encouraged a palliative approach and making the code status DNR.  She would like to speak with Dr. Newell Coral as well as Palliative medicine.  - Inter-disciplinary family meet or Palliative Care meeting due by:  8/5  My cc time 40 minutes  Heber [place], MD [place] [facility] Pager: [phone] Cell: [phone] After 3pm or if no response, call [phone]

## 2015-08-11 NOTE — Progress Notes (Signed)
Patient's daughter requested for [place] to call an MD to the bedside to talk to the family about moving the patient to comfort care.   Dr. Jamison Neighbor came and spoke with the patient's husband, daughter and granddaughter. Husband shared that the patient had made it clear that she would not want to live on a machine. Family was given information on how comfort care looks and what to expect.  Family will let RN know when they are ready to withdraw the ETT and start comfort care.

## 2015-08-11 NOTE — Progress Notes (Signed)
eLink Physician-Brief Progress Note Patient Name: Sabrina Powell DOB: 1946-08-28 MRN: [id number]   Date of Service  08/11/2015  HPI/Events of Note  Pall care met with fam Wish comfort care  eICU Interventions  D/w pall care Added adult wd, dilauded wished ativan     Intervention Category Major Interventions: End of life / care limitation discussion  Raylene Miyamoto. 08/11/2015, 7:19 PM

## 2015-08-11 NOTE — Progress Notes (Signed)
Subjective: Asked to come and speak with the patient's family regarding condition and prognosis from a neurosurgical perspective.  Objective: Vital signs in last 24 hours: Filed Vitals:   08/11/15 0800 08/11/15 0805 08/11/15 1123 08/11/15 1220  BP: 107/56     Pulse: 92 94 93   Temp:  99.3 F (37.4 C)  98.2 F (36.8 C)  TempSrc:  Oral  Axillary  Resp: _0 Height:      Weight:      SpO2: 98% 98% 98%     Intake/Output from previous day: 09/29 0701 - 09/30 0700 In: 6433 [I.V.:4178; NG/GT:260; IV Piggyback:705] Out: 280 [Urine:275; Drains:5] Intake/Output this shift: Total I/O In: 235 [I.V.:175; NG/GT:60] Out: 25 [Urine:25]   CBC  Recent Labs  08/10/15 0400 08/11/15 0600  WBC 18.2* 33.7*  HGB 9.2* 9.4*  HCT 26.5* 27.4*  PLT 139* 178   BMET  Recent Labs  08/10/15 0400 08/11/15 0600  NA 141 142  K 4.0 3.6  CL 112* 116*  CO2 21* 17*  GLUCOSE 108* 113*  BUN 17 16  CREATININE 0.92 1.13*  CALCIUM 7.0* 6.4*    Assessment/Plan: Met with the patient's husband, daughter, grandson, and granddaughter, along with her nursing staff from [facility] ICU.  We discussed her progress since surgery 2 days ago, her neurologic findings yesterday morning and again this morning, and the findings of her CT scan yesterday morning (including the good evacuation of the subdural hematoma, with decreased mass effect and shift, with only mild recurrence of subdural collection, but the findings of bilateral occipital infarcts, consistent with the presenting herniation). We discussed her hepatic dysfunction with a markedly elevated ammonia level, as well as her declining renal function (increasing creatinine and decreasing GFR).  I explained that the patient remains in a coma, although she has recovered some mid brain function, evidence by her recovery of pupillary reaction. However she continues does not open her eyes to voice or pain, and remains decerebrate to central pain.  We further discussed  that her hyperammonemia will to some extent confound her neurologic assessment, and in and of itself could contribute to coma.  I explained that it remains unlikely that she will survive what is now at least 3 system (brain, hepatic, renal) major dysfunction, but that if she were to survive, it is most likely that she would remain in a comatose/vegetative state and would require total care, with tracheostomy, gastric tube for feedings, etc.  We discussed options for further treatment and care ranging from continued full intensive care interventions to establishing a DO NOT RESUSCITATE order to withdrawal of intensive care measures and supportive/comfort care subsequently.  The family's questions were answered thoroughly for them. They've indicated that for now they would like her to be a DO NOT RESUSCITATE, and the nursing staff will contact CCM for the appropriate order.  They're going to further consider and discuss the question of withdrawal of intensive care measures and subsequent supportive/comfort care only.   Hosie Spangle, MD 08/11/2015, 1:06 PM

## 2015-08-11 NOTE — Consult Note (Signed)
Consultation Note Date: 08/11/2015   Patient Name: Sabrina Powell  DOB: 01/04/1946  MRN: [id number]  Age / Sex: 69 y.o., female   PCP: Frazier Richards, MD Referring Physician: Dickie La, MD  Reason for Consultation: Non pain symptom management, Pain control, Psychosocial/spiritual support, Terminal care and Withdrawal of life-sustaining treatment  Palliative Care Assessment and Plan Summary of Established Goals of Care and Medical Treatment Preferences   Clinical Assessment/Narrative: Pt is a 69 yo female admitted with alcohol withdrawal. Pt has been trying to taper down at home. She has been drinking about a pint a day for the past 2 years and has recently been trying to stop drinking, but whenever she cuts down she begins to get shaky, feel sick. She has not been eating for the past 6 months. She has an albumin level of 1.8. She has been falling at home. Pt fell while in the hospital on 9/28. Per CT scan she had an acute on chronic SDH and underwent evacuation and craniotomy. Pt's family has met with CCM as well as neurosurgery and after being informed of  poor prognosis for any meaningful recovery family has decided to withdraw ventilation and pursue comfort measures. Her daughter, husband, granddaughter, and grandson were present and all agreed she would not want to live this way. I did discuss specifics in terms of withdrawal of vent including starting dilaudid infusion prior to extubation to insure no resp distress or pain, as well as feeding , fluids will be withdrawn to prevent fluid overload and additional stress. I also gave the family a tour of 6N in case transfer out of ICU becomes an option. Attempted to prepare family that pt could pass quickly after removal of breathing tube or she could live hours to days. At this point I did excuse myself while family continued discussion about when to withdraw life support. At the time I met with family it was not a question of if to withdraw but a  question of when for them.  RN then paged me at 1910 and informed me that family was ready. I at this time called Dr. Harland Dingwall in [place] and staffed case and prepared for terminal wean  Contacts/Participants in Discussion: Primary Decision Maker: Husband HCPOA: yes  Husband, daughter, granddaughter, grandson, and SIL. All in agreement that pt would not want to life on life support and in agreement with terminal wean  Code Status/Advance Care Planning:  DNR  Terminal extubation  Start dilaudid continuous infusion prior to extubation. CCM to order  DC feeding, IVF to avoid fluid overload  Transfer to 6 N if family agreeable  Symptom Management:   Pain/dyspnea: Starting dilaudid continuous infusion. CCM ordering. Palliative Care will continue management if pt survives extubation and is transferred out of unit  Withdrawal: Although pt has been in the hospital for several days, will start scheduled ativan to ensure against withdrawal. See CCM order  Secretions: Start scheduled robinul 0.2 mg q4 ATC and prn  Palliative Prophylaxis: eye lubrication. No aggressive bowel regimin given clinical condition  Additional Recommendations (Limitations, Scope, Preferences):  If pt's survival becomes protracted will speak to family about in-patient hospice as an option Psycho-social/Spiritual:   Support System: yes  Desire for further Chaplaincy support:no  Prognosis: Hours - Days  Discharge Planning:  Anticipate hospital death        Chief Complaint/History of Present Illness: Pt is a 69 yo female admitted in alcohol withdrawal. She has a h/o malnutrition, multiple falls, and also sustained  a fall in the hospital  Primary Diagnoses  Present on Admission:  . Alcohol abuse . Elevated LFTs . Tachycardia  Palliative Review of Systems: Pt ventilated I have reviewed the medical record, interviewed the patient and family, and examined the patient. The following aspects are  pertinent.  Past Medical History  Diagnosis Date  . HTN (hypertension)   . HLD (hyperlipidemia)   . Overweight(278.02)   . Allergic rhinitis   . Asthma, mild persistent   . IDA (iron deficiency anemia)   . Pneumonia 1994  . Hypothyroidism   . Depression   . Anxiety   . Alcoholism /alcohol abuse     one prior episode of DTs    Social History   Social History  . Marital Status: Married    Spouse Name: N/A  . Number of Children: N/A  . Years of Education: N/A   Occupational History  . 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 Smoking status: Former Smoker -- 2.00 packs/day for 25 years    Types: Cigarettes    Quit date: 02/13/1994  . Smokeless tobacco: Never Used  . Alcohol Use: 21.0 oz/week    35 Shots of liquor per week     Comment: 08/06/2015 "1 pint vodka/day til 07/2015; now only 1/2 pint vodka daily"  . Drug Use: No  . Sexual Activity: No   Other Topics Concern  . None   Social History Narrative   Family History  Problem Relation Age of Onset  . Hypertension Mother   . Cancer Father    Scheduled Meds: . albuterol  2.5 mg Nebulization Q6H  . antiseptic oral rinse  7 mL Mouth Rinse 10 times per day  . cefTAZidime (FORTAZ)  IV  2 g Intravenous Q12H  . chlorhexidine gluconate  15 mL Mouth Rinse BID  . folic acid  1 mg Intravenous Daily  . Influenza vac split quadrivalent PF  0.5 mL Intramuscular Tomorrow-1000  . insulin aspart  2-6 Units Subcutaneous 6 times per day  . lactulose  30 g Oral TID  . levETIRAcetam  500 mg Intravenous Q12H  . levothyroxine  25 mcg Intravenous Daily  . sodium chloride  3 mL Intravenous Q12H  . thiamine IV  100 mg Intravenous Daily  . vancomycin  500 mg Intravenous Q12H   Continuous Infusions: . sodium chloride 150 mL/hr at 08/11/15 0700  . dexmedetomidine 0.4 mcg/kg/hr (08/11/15 1748)  . feeding supplement (VITAL AF 1.2 CAL) 1,000 mL (08/11/15 0800)  . pantoprozole (PROTONIX) infusion 8 mg/hr  (08/11/15 1537)   PRN Meds:.acetaminophen, fentaNYL (SUBLIMAZE) injection, fentaNYL (SUBLIMAZE) injection Medications Prior to Admission:  Prior to Admission medications   Medication Sig Start Date End Date Taking? Authorizing Haley Roza  albuterol (PROAIR HFA) 108 (90 BASE) MCG/ACT inhaler 2 puffs inhaled every 4 hours as needed shortness of breath.  Dispense generic. Patient taking differently: Inhale 2 puffs into the lungs every 4 (four) hours as needed. 2 puffs inhaled every 4 hours as needed shortness of breath.  Dispense generic. 03/13/15  Yes Frazier Richards, MD  aspirin 81 MG tablet Take 81 mg by mouth daily.   Yes Historical Daizy Outen, MD  Budesonide (PULMICORT FLEXHALER) 90 MCG/ACT inhaler Inhale 1 puff into the lungs 2 (two) times daily. regularly Patient taking differently: Inhale 1 puff into the lungs 2 (two) times daily as needed.  03/13/15  Yes Frazier Richards, MD  Calcium Carbonate 1500 MG TABS Take  1,500 mg by mouth daily.    Yes Historical Nazareth Norenberg, MD  carvedilol (COREG) 6.25 MG tablet Take 1 tablet (6.25 mg total) by mouth 2 (two) times daily with a meal. 07/19/15  Yes Frazier Richards, MD  fluticasone (FLOVENT HFA) 110 MCG/ACT inhaler Inhale 1 puff into the lungs 2 (two) times daily. Patient taking differently: Inhale 1 puff into the lungs 2 (two) times daily as needed.  01/18/14  Yes Frazier Richards, MD  Multiple Vitamin (MULTIVITAMIN) capsule Take 1 capsule by mouth daily.     Yes Historical Sadhana Frater, MD  naphazoline-pheniramine (NAPHCON-A) 0.025-0.3 % ophthalmic solution Place 1 drop into both eyes 4 (four) times daily as needed for irritation.   Yes Historical Liany Mumpower, MD  Omega-3 Fatty Acids (FISH OIL) 1200 MG CAPS Take 1 capsule by mouth daily. Take 2 tabs daily   Yes Historical Brandi Tomlinson, MD  aspirin EC 81 MG tablet Take 1 tablet (81 mg total) by mouth daily. 01/18/14 01/18/15  Frazier Richards, MD  fluticasone (FLONASE) 50 MCG/ACT nasal spray Place 1 spray into the nose daily. 02/08/13 02/08/14   Jacquelyn A McGill, MD  sertraline (ZOLOFT) 100 MG tablet Take 1 tablet (100 mg total) by mouth daily. Patient not taking: Reported on 08/03/2015 07/14/15   Frazier Richards, MD   Allergies  Allergen Reactions  . Amlodipine Besylate     REACTION: LE edema   CBC:    Component Value Date/Time   WBC 33.7* 08/11/2015 0600   HGB 9.4* 08/11/2015 0600   HCT 27.4* 08/11/2015 0600   PLT 178 08/11/2015 0600   MCV 93.5 08/11/2015 0600   NEUTROABS 15.6* 08/10/2015 0400   LYMPHSABS 1.2 08/10/2015 0400   MONOABS 1.2* 08/10/2015 0400   EOSABS 0.2 08/10/2015 0400   BASOSABS 0.0 08/10/2015 0400   Comprehensive Metabolic Panel:    Component Value Date/Time   NA 142 08/11/2015 0600   K 3.6 08/11/2015 0600   CL 116* 08/11/2015 0600   CO2 17* 08/11/2015 0600   BUN 16 08/11/2015 0600   CREATININE 1.13* 08/11/2015 0600   CREATININE 0.70 07/14/2015 0927   GLUCOSE 113* 08/11/2015 0600   CALCIUM 6.4* 08/11/2015 0600   AST 196* 07/20/2015 1720   ALT 46 07/21/2015 1720   ALKPHOS 537* 07/18/2015 1720   BILITOT 3.7* 07/14/2015 1720   PROT 5.1* 07/14/2015 1720   ALBUMIN 1.8* 07/20/2015 1720    Physical Exam: Vital Signs: BP 96/48 mmHg  Pulse 67  Temp(Src) 97.5 F (36.4 C) (Oral)  Resp 18  Ht _0  (1.575 m)  Wt 63.5 kg (139 lb 15.9 oz)  BMI 25.60 kg/m2  SpO2 99% SpO2: SpO2: 99 % O2 Device: O2 Device: Ventilator O2 Flow Rate:   Intake/output summary:  Intake/Output Summary (Last 24 hours) at 08/11/15 1906 Last data filed at 08/11/15 1700  Gross per 24 hour  Intake 4598.15 ml  Output    227 ml  Net 4371.15 ml   LBM: Last BM Date: 08/10/15 Baseline Weight: Weight: 53.524 kg (118 lb) Most recent weight: Weight: 63.5 kg (139 lb 15.9 oz)  Exam Findings:  General: Well nourished older female on ventilator Resp: On ventilator. She is breathing over the vent at times         Palliative Performance Scale: 10%              Additional Data Reviewed: Recent Labs     08/10/15  0400   08/11/15  0600  WBC  18.2*  33.7*  HGB  9.2*  9.4*  PLT  139*  178  NA  141  142  BUN  17  16  CREATININE  0.92  1.13*     Time In: 1745 Time Out: 1915 Time Total: 90 min Greater than 50%  of this time was spent counseling and coordinating care related to the above assessment and plan. Staffed with Dr. Titus Mould, and unit RN  Signed by: Dory Horn, NP  Dory Horn, NP  08/11/2015, 7:06 PM  Please contact Palliative Medicine Team phone at [phone] for questions and concerns.

## 2015-08-11 NOTE — Procedures (Signed)
Extubation Procedure Note  Patient Details:   Name: Sabrina Powell DOB: [birth year] MRN: [id number]   Airway Documentation:  Airway 7.5 mm (Active)  Secured at (cm) 21 cm 08/11/2015  4:51 PM  Measured From Lips 08/11/2015  4:51 PM  Secured Location Right 08/11/2015  4:51 PM  Secured By [employer] 08/11/2015  4:51 PM  Tube Holder Repositioned Yes 08/11/2015  4:51 PM  Cuff Pressure (cm H2O) 25 cm H2O 08/10/2015 11:31 PM  Site Condition Dry 08/11/2015  4:51 PM   Pt extubated for palliative care to a 2LNC. Evaluation  O2 sats: transiently fell during during procedure Complications: No apparent complications Patient did tolerate procedure well. Bilateral Breath Sounds: Clear, Diminished Suctioning: Airway No  Diontay D Marcello Moores 08/11/2015, 8:38 PM

## 2015-08-11 NOTE — Progress Notes (Signed)
Subjective: Patient continues on ventilator via ETT in 46M ICU. Urine output poor. CT of brain without contrast yesterday morning shows good evacuation of massive right hemispheric acute subdural hematoma with herniation, with mild reaccumulation. However CT shows evidence of bilateral occipital lobe infarcts consistent with her presenting herniation. Little in the way of drainage into Jackson-Pratt drain. Not requiring any sedation.  Objective: Vital signs in last 24 hours: Filed Vitals:   08/11/15 0432 08/11/15 0500 08/11/15 0600 08/11/15 0805  BP:  125/66 122/57   Pulse:  94 90 94  Temp:    99.3 F (37.4 C)  TempSrc:    Oral  Resp:  Height:      Weight: 63.5 kg (139 lb 15.9 oz)     SpO2:  97% 98% 98%    Intake/Output from previous day: 09/29 0701 - 09/30 0700 In: 4968 [I.V.:4003; NG/GT:260; IV Piggyback:705] Out: 275 [Urine:275] Intake/Output this shift:    Physical Exam:  Not opening eyes to voice or pain. Pupils 5 mm, round, reactive to light. Mild side to side roving eye movements. Sclera remained anicteric. Decerebrates bilaterally to central pain, somewhat less vigorously than yesterday.  CBC  Recent Labs  08/10/15 0400 08/11/15 0600  WBC 18.2* 33.7*  HGB 9.2* 9.4*  HCT 26.5* 27.4*  PLT 139* 178   BMET  Recent Labs  08/10/15 0400 08/11/15 0600  NA 141 142  K 4.0 3.6  CL 112* 116*  CO2 21* 17*  GLUCOSE 108* 113*  BUN 17 16  CREATININE 0.92 1.13*  CALCIUM 7.0* 6.4*    Studies/Results: Ct Head Wo Contrast  08/10/2015   CLINICAL DATA:  Subdural hematoma.  Status post craniotomy.  EXAM: CT HEAD WITHOUT CONTRAST  TECHNIQUE: Contiguous axial images were obtained from the base of the skull through the vertex without intravenous contrast.  COMPARISON:  CT head without contrast 07/24/2015.  FINDINGS: A right-sided craniectomy is noted. There is a drain in the extra-axial space on the right. There is significant reduction in the extent of the previously  noted subdural hemorrhage. 7 mm of midline shift remains at the foramen of Monro compared with 14 mm on the prior exam.  There is interval development of significant hypoattenuation involving the left occipital lobe compatible with developing infarct. A larger infarct territory is present on the right involving the posterior right temporal lobe, right occipital lobe, and portions of the right parietal lobe extending to the vertex. There is continued mass effect and effacement of the sulci over the right hemisphere. Partial effacement of the right lateral ventricle, particular posteriorly is again noted.  A much smaller volume extra-axial hemorrhage on the left is again seen without significant interval change. Fourth ventricle volume is improved. The basal cisterns are better visualized on today's study.  Chronic cerebellar atrophy is again seen.  No new areas of hemorrhage are evident.  IMPRESSION: 1. Right frontoparietal craniectomy with significant decompression of the prominent right extra-axial hemorrhage. 2. Reduction in mass effect with decrease in midline shift from 14 to 7 mm. 3. Persistent effacement of the sulci on the right and effacement of the posterior right lateral ventricle. 4. Interval development of infarcts involving the left occipital lobe and more extensively on the right with involvement of the right occipital lobe, posterior right temporal lobe, and medial right parietal lobe.   Electronically Signed   By: Marin Roberts M.D.   On: 08/10/2015 08:34   Ct Head Wo Contrast  07/16/2015   ADDENDUM  REPORT: 07/31/2015 15:11 ADDENDUM: Critical Value/emergent results were called by telephone at the time of interpretation on 07/22/2015 at 2:20pm to Dr. Beverely Low, MD , who verbally acknowledged these results. Electronically Signed   By: Charlett Nose M.D.   On: 07/29/2015 15:11  08/08/2015   CLINICAL DATA:  Fall, on anticoagulation. Altered mental status since fall. Right forehead hematoma.   EXAM: CT HEAD WITHOUT CONTRAST  TECHNIQUE: Contiguous axial images were obtained from the base of the skull through the vertex without intravenous contrast.  COMPARISON:  None.  FINDINGS: There is a large right mixed density subdural hematoma. This could reflect acute on chronic subdural hemorrhage. This measures approximately 18 mm in thickness. There is right to left midline shift of 16 mm. No intraparenchymal hemorrhage. No hydrocephalus. No acute calvarial abnormality.  Visualized paranasal sinuses and mastoids clear. Orbital soft tissues unremarkable.  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  Electronically Signed: By: Charlett Nose M.D. On: 08/10/2015 14:14   Dg Chest Port 1 View  08/11/2015   CLINICAL DATA:  Intubated patient acute respiratory failure with hypoxia, subdural hematoma with acute encephalopathy.  EXAM: PORTABLE CHEST 1 VIEW  COMPARISON:  Portable chest x-ray of August 10, 2015  FINDINGS: The endotracheal tube tip lies 2 cm above the carina. The esophagogastric tube tip projects below the inferior margin of the image. The lungs are mildly hypoinflated. There is slightly increased interstitial density at the left lung base. There is no pleural effusion. The heart and pulmonary vascularity are normal.  IMPRESSION: Intubated patient, mild hypoinflation with early subsegmental atelectasis in the left lower lobe.   Electronically Signed   By: David  [place] M.D.   On: 08/11/2015 07:33   Dg Chest Port 1 View  08/10/2015   CLINICAL DATA:  ETT  EXAM: PORTABLE CHEST 1 VIEW  COMPARISON:  07/27/2015  FINDINGS: Endotracheal tube terminates 2.5 cm above the carina.  Mild left basilar opacity, likely atelectasis. Associated small left pleural effusion. No pneumothorax.  The heart is normal in size.  IMPRESSION: Endotracheal tube terminates 2.5 cm above the carina.  Mild left basilar opacity, likely  atelectasis.  Small left pleural effusion.   Electronically Signed   By: Charline Bills M.D.   On: 08/10/2015 13:11   Portable Chest Xray  07/20/2015   CLINICAL DATA:  Endotracheal tube placement  EXAM: PORTABLE CHEST 1 VIEW  COMPARISON:  02/13/2011  FINDINGS: Endotracheal tube with the tip 2.8 cm above the carina. Nasogastric tube coursing below the diaphragm.  There is no focal parenchymal opacity. There is no pleural effusion or pneumothorax. The heart and mediastinal contours are unremarkable.  The osseous structures are unremarkable.  IMPRESSION: Endotracheal tube with the tip 2.8 cm above the carina.   Electronically Signed   By: Elige Ko   On: 07/21/2015 14:59   Dg Abd Portable 1v  07/20/2015   CLINICAL DATA:  Evaluate orogastric tube  EXAM: PORTABLE ABDOMEN - 1 VIEW  COMPARISON:  None.  FINDINGS: Orogastric tube tip at the distal stomach.  Visualized bowel gas pattern is nonobstructive.  Probable small left pleural effusion.  IMPRESSION: Orogastric tube tip at the distal stomach.   Electronically Signed   By: Marnee Spring M.D.   On: 08/05/2015 21:45    Assessment/Plan: Patient remains in coma. With little the way of Jackson-Pratt drainage, drain removed, and dressing applied. Cranial incision healing  nicely, clean and dry, no erythema. Abdominal dressing removed as well and wound left open to air. No erythema, swelling, or drainage.  Neurologically without evidence of further recovery. Prognosis remains quite poor, particularly in consideration of her significant medical comorbidities including hepatic dysfunction and possible impending renal dysfunction.   Hewitt Shorts, MD 08/11/2015, 8:10 AM

## 2015-08-11 NOTE — Progress Notes (Signed)
PCCM Attending Family Discussion:  I spoke with the patient's husband, daughter, & granddaughter at bedside. We discussed her current clinical state as well as multisystem organ failure. The patient's husband reports that she would not wish to be a burden or in a vegetative state. When questioned what she would say if she were able to speak with [place] at this time regarding her clinical state and prognosis he reaffirmed that she would most likely instruct [place] to withdraw care. I urged him to consider this but he himself admits that he is "not ready to let go". He is going to discuss this further with his daughter. She will remain DO NOT RESUSCITATE status.  Donna Christen Jamison Neighbor, M.D. [facility] Pager:  [phone] After 3pm or if no response, call [phone]

## 2015-08-12 DEATH — deceased

## 2015-08-13 LAB — TYPE AND SCREEN
ABO/RH(D): O NEG
Antibody Screen: NEGATIVE
Unit division: 0
Unit division: 0
Unit division: 0
Unit division: 0
Unit division: 0
Unit division: 0
Unit division: 0

## 2015-08-14 LAB — CULTURE, BLOOD (ROUTINE X 2)
CULTURE: NO GROWTH
CULTURE: NO GROWTH

## 2015-08-17 ENCOUNTER — Encounter (HOSPITAL_COMMUNITY): Payer: Self-pay | Admitting: Neurosurgery

## 2015-08-25 ENCOUNTER — Ambulatory Visit (HOSPITAL_COMMUNITY): Payer: Medicare Other | Admitting: Psychiatry

## 2015-09-12 NOTE — Progress Notes (Addendum)
Pt time of death 0808, passed in the presence of her daughter and granddaughter and another gentleman. Husband was not present, but the daughter called him as soon as we realized death was imminent.   The absence of heartbeat was confirmed at 0808 by myself, Emelia Salisbury, RN and Sullivan Lone, RN.  CDS and ME are being contacted.

## 2015-09-12 NOTE — Progress Notes (Signed)
No new issues. Continue end-of-life care.

## 2015-09-12 NOTE — Discharge Summary (Signed)
PULMONARY / CRITICAL CARE MEDICINE DEATH NOTE   Name: Sabrina Powell MRN: [id number] DOB: 1945/12/16    ADMISSION DATE:  December 30, 2014 CONSULTATION DATE:  08/01/2015 Date of death 08/31/2015  REFERRING MD :  Jennette KettleNeal [facility]PT[facility]  Cause of death:  Subdural hematoma Multiorgan failure Alcoholism   Hospital Course: 69 year old female with PMH ETOH abuse [facility]. At time of admission she had no neurological deficits, however 9/28 she was found lying on floor next to bed with decreased LOC, which quickly deteriorated to agonal respirations requiring intubation. Patient was sent for head CT and PCCM consulted for ICU transfer. She was found to have a large subdural hematoma. She underwent a right craniectomy and a repeat head CT showed reduction in midline shift but there was significant findings worrisome for an acute ischemic stroke in multiple areas of the brain. After this CT scan she remained on life support and was developing progressive multiorgan failure. Lengthy conversations were held with the patient's family who noted progressive deterioration in functional status primarily related to alcoholism. They felt that further aggressive care is not appropriate. Bout of medicine was consulted. After conversation with palliative medicine patient's family decided to withdraw care on 08/13/2015.

## 2015-09-12 NOTE — Progress Notes (Signed)
   09/05/15 1610  Notifications  Bed Control notified that Post Mortem checklist is complete Yes  Bed Control notified body transferred Transported to morgue  Other Notifications Consulting civil engineer) family, medical examiner, and CDS contacted  Attending Physican Contact  Attending Physician Notified Y  Attending Physician Name Noberto [facility]  Will the above attending physician sign death certificate?  (non ED physician) No  Post Mortem Checklist  Date of Death 2015/09/05  Time of Death 0808  Pronounced By Sabrina Lone, RN & Sabrina Salisbury, RN  Next of kin notified Yes  Name of next of kin notified of death Sabrina Powell  Contact Person's Relationship to Patient Spouse  Contact Person's Phone Number [phone]  Contact Person's address [address], [place], [place]  Family Communication Notes Pt spouse, daughter, and grandchildren at bedside  Was the patient a No Code Blue or a Limited Code Blue? Yes  Did the patient die unattended? No  Patient restrained? Not applicable  Weight 63.5 kg (139 lb 15.9 oz)  Body preparation complete Y  [facility]  Notification Date 09-05-15  Notification Time 0900  [place] [facility] Number [id number]  Is patient a potential donor? Y  Donation Type Tissue  Eye prep completed (no-not potential eye donor)  Autopsy  Autopsy requested by N/A  Patient Belongings/Medications Returned  Patient belongings from bedside/safe/pharmacy returned  Yes  Valuables returned to? spouse-Sabrina   Specify valuables returned wedding ring  Dead on Arrival (Emergency Department)  Patient dead on arrival? No  Medical Examiner  Is this a medical examiner's case? Y  Date Medical Examiner notified 05-Sep-2015  Time Medical Examiner notified [phone]  Name of Medical Examiner Sabrina Powell  Name of person who notified medical examiner Sabrina Powell  Does the Medical Examiner consider this an ME case? Sabrina Powell Home  Funeral home name/address/phone # [employer]  [facility] [address] [place] [phone]  Planned location of pickup [place]

## 2015-09-12 NOTE — Progress Notes (Signed)
Pt passed peacefully this morning. Offered condolences to the family. Also informed them of bereavement services through local hospices that they can utilize even though Mrs. Kever was not a hospice pt. Informed family also since pt fell she would be a ME case, but that did not necessarily mean she would have an autopsy, just that records would be reviewed as an initial step. Family appreciative. Eduard Roux, ANP Palliative Medicine Team

## 2015-09-12 DEATH — deceased

## 2016-01-13 IMAGING — CT CT HEAD W/O CM
2 series · 15 of 30 positions shown, 17 images · non-contrast
Comparison: None.

ADDENDUM:
Critical Value/emergent results were called by telephone at the time
of interpretation on 08/09/2015 at [DATE] to Dr. Seitz, Farhiya ,
who verbally acknowledged these results.
CLINICAL DATA: Fall, on anticoagulation. Altered mental status
since fall. Right forehead hematoma.

EXAM:
CT HEAD WITHOUT CONTRAST
TECHNIQUE: Contiguous axial images were obtained from the base of the skull
through the vertex without intravenous contrast.

[Series 2: head without · axial · non-contrast · 0.41mm/px · z∈[+1031,+1141]mm · 7 of 30 slices shown, 9 images]
[im 4/30  brain]
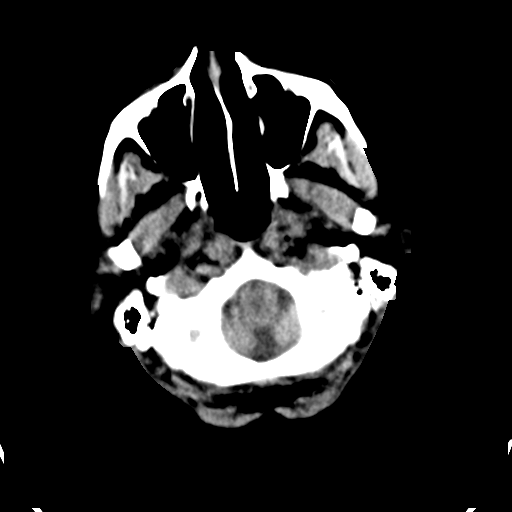
[im 4/30  bone]
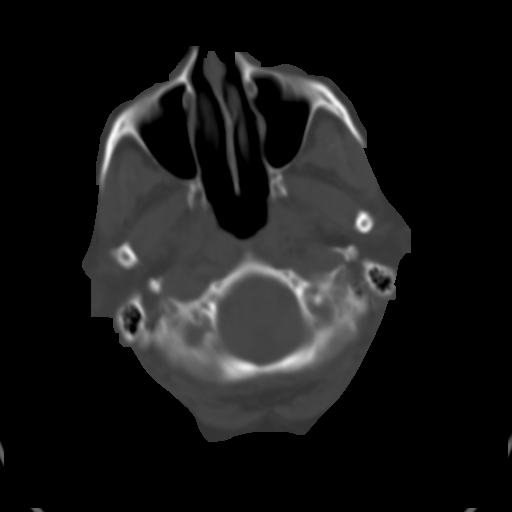
[im 8/30  brain]
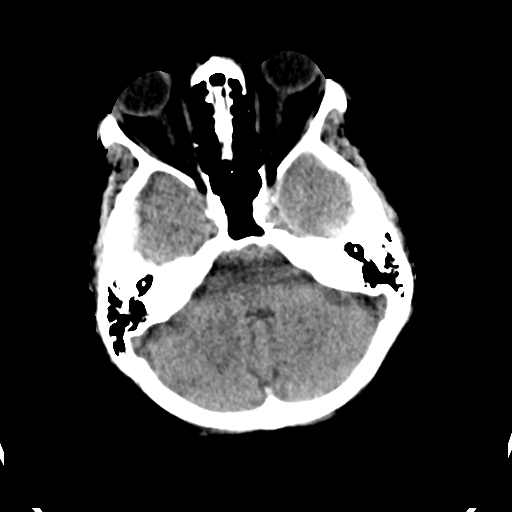
[im 11/30  brain]
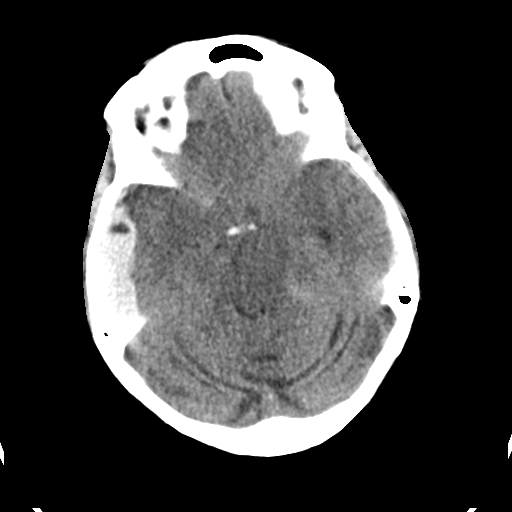
[im 15/30  brain]
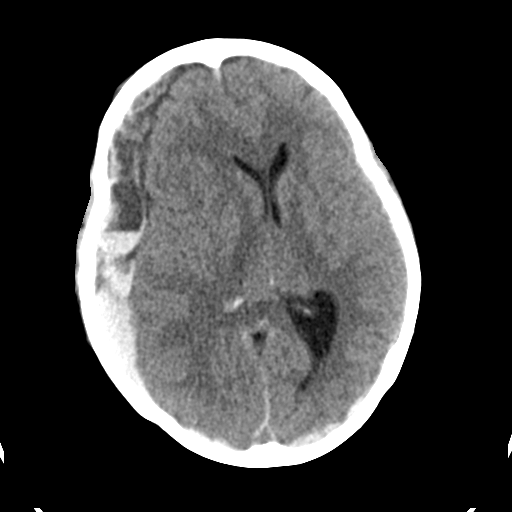
[im 19/30  brain]
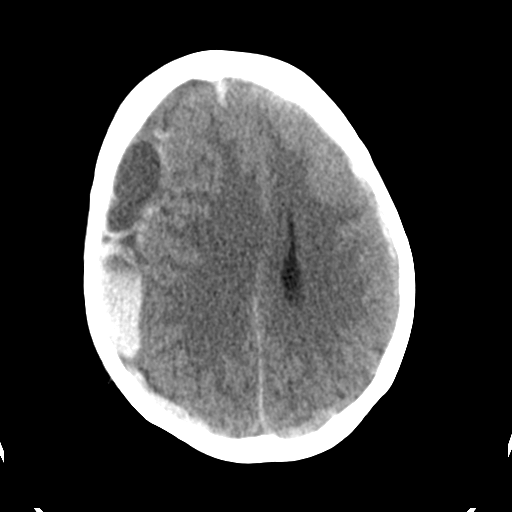
[im 19/30  bone]
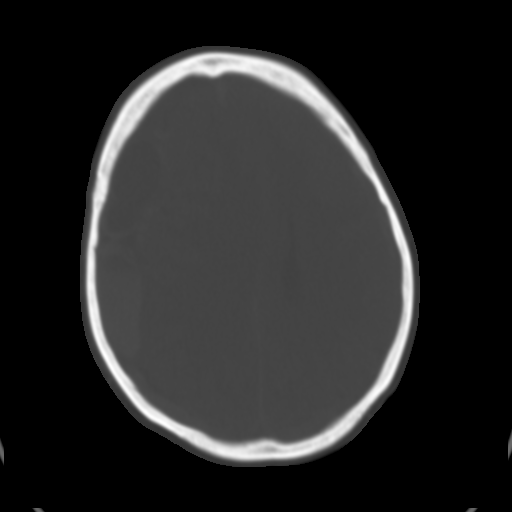
[im 22/30  brain]
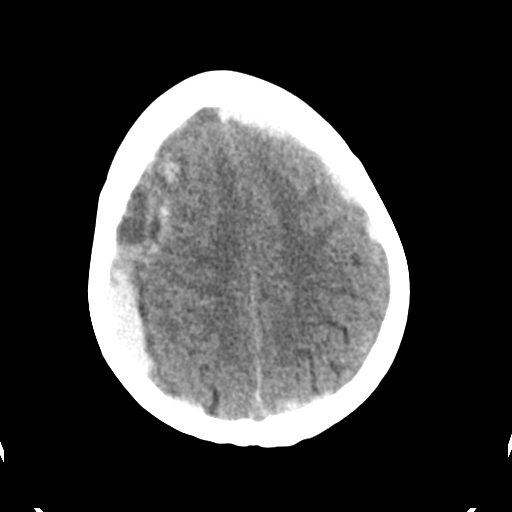
[im 26/30  brain]
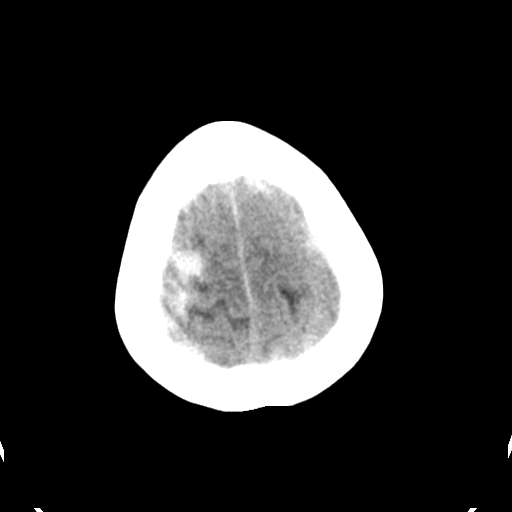

[Series 3: head bone · axial · 0.41mm/px · z∈[+1030,+1148]mm · 8 of 75 slices shown]
[im 8/75  bone]
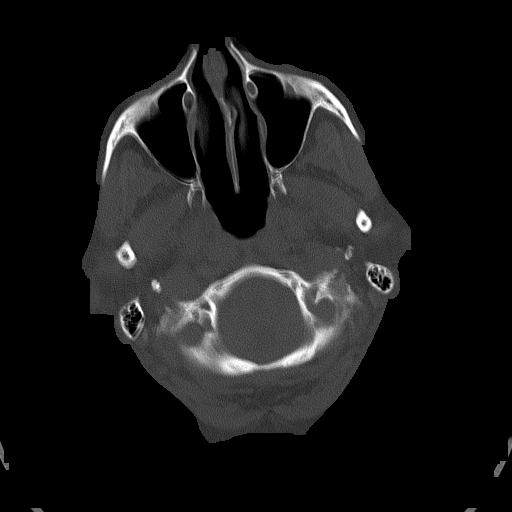
[im 15/75  bone]
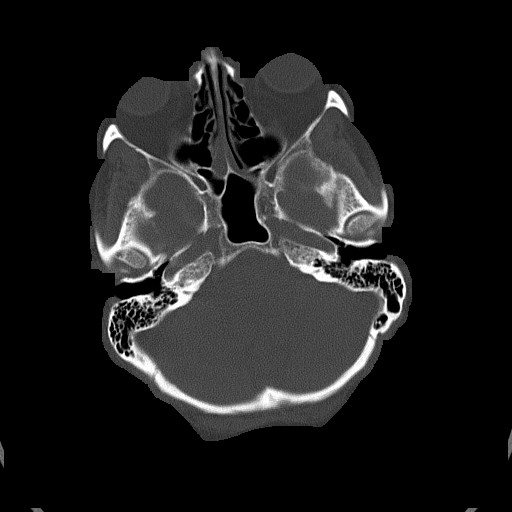
[im 23/75  bone]
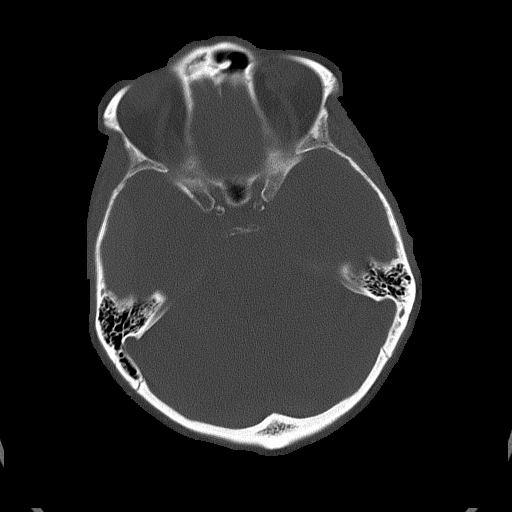
[im 34/75  bone]
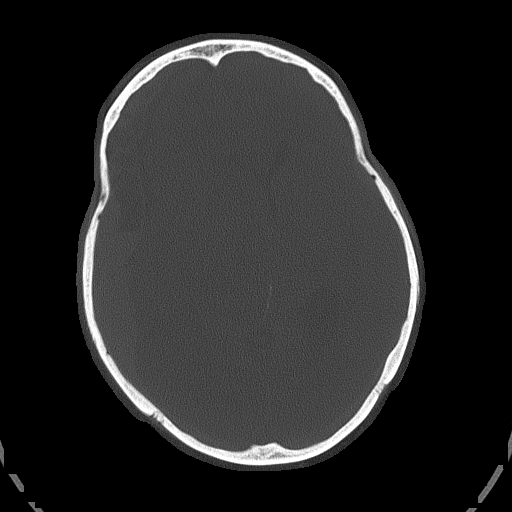
[im 41/75  bone]
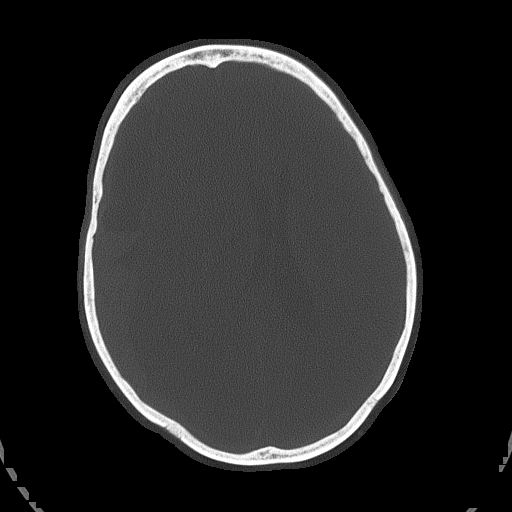
[im 52/75  bone]
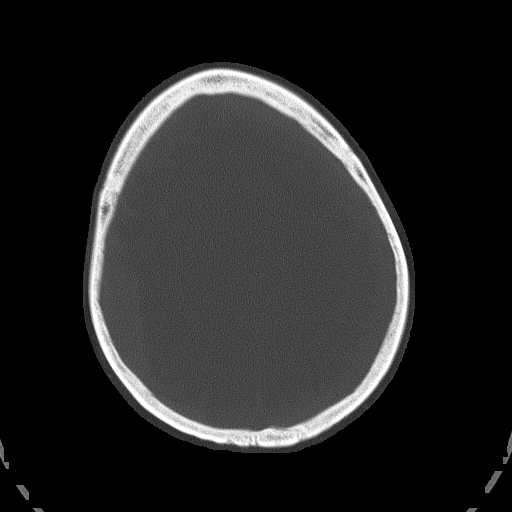
[im 60/75  bone]
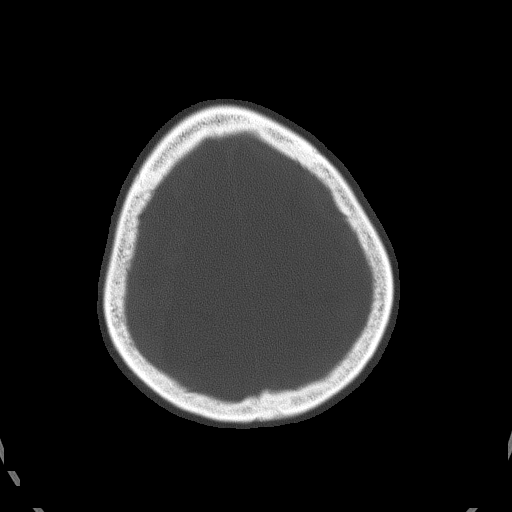
[im 67/75  bone]
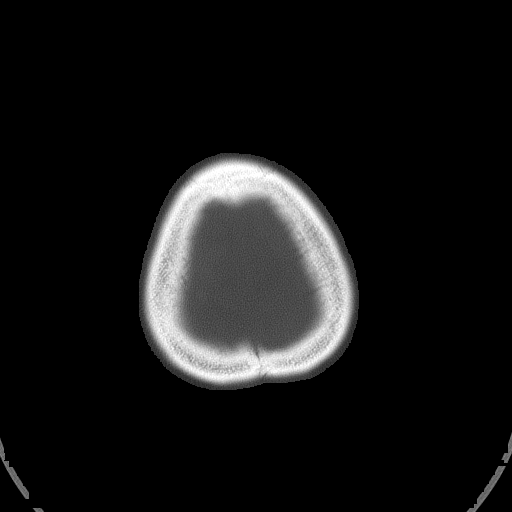

[15 of 30 positions shown; findings below may reference images not displayed]

FINDINGS: There is a large right mixed density subdural hematoma. This could
reflect acute on chronic subdural hemorrhage. This measures
approximately 18 mm in thickness. There is right to left midline
shift of 16 mm. No intraparenchymal hemorrhage. No hydrocephalus. No
acute calvarial abnormality.

Visualized paranasal sinuses and mastoids clear. Orbital soft
tissues unremarkable.
IMPRESSION: Large right mixed density subdural hematoma, likely acute on chronic
measuring up to 18 mm with 16 mm of right to left midline shift.

## 2017-02-09 IMAGING — US US ABDOMEN LIMITED
1 series · 14 of 25 positions shown · non-contrast
Comparison: None.

CLINICAL DATA: Hepatic cirrhosis with alcoholism.

EXAM:
US ABDOMEN LIMITED - RIGHT UPPER QUADRANT

[Series 1: us abdomen limited · 0.26mm/px · 14 of 66 slices shown]
[im 1/66]
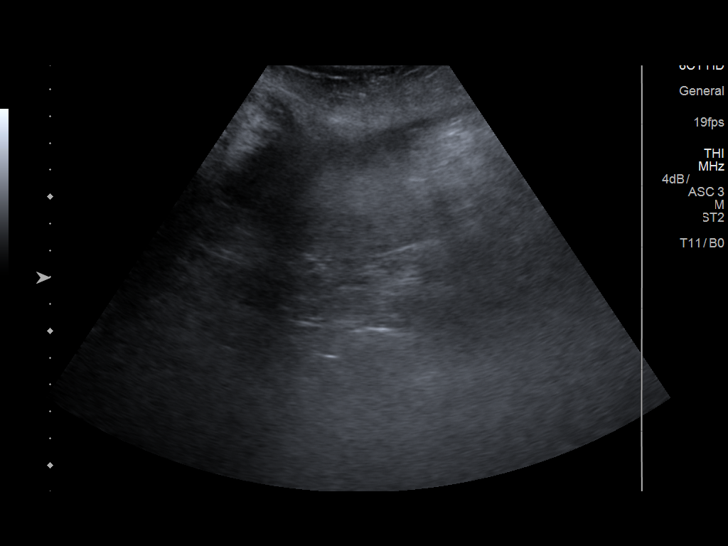
[im 6/66]
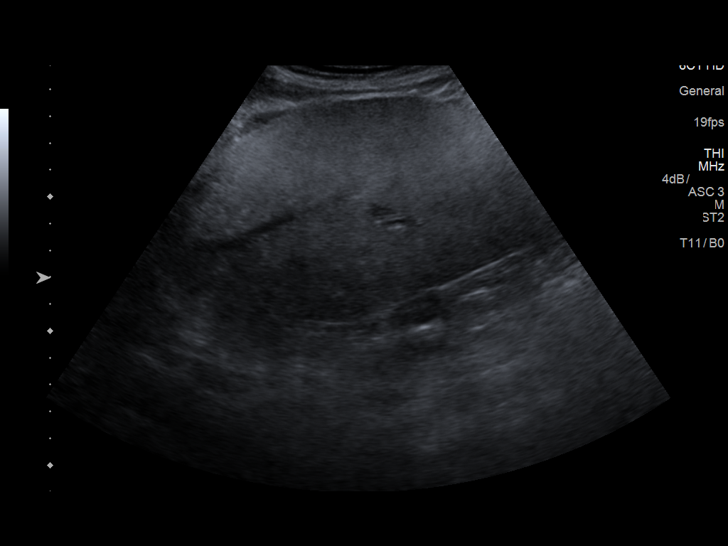
[im 11/66]
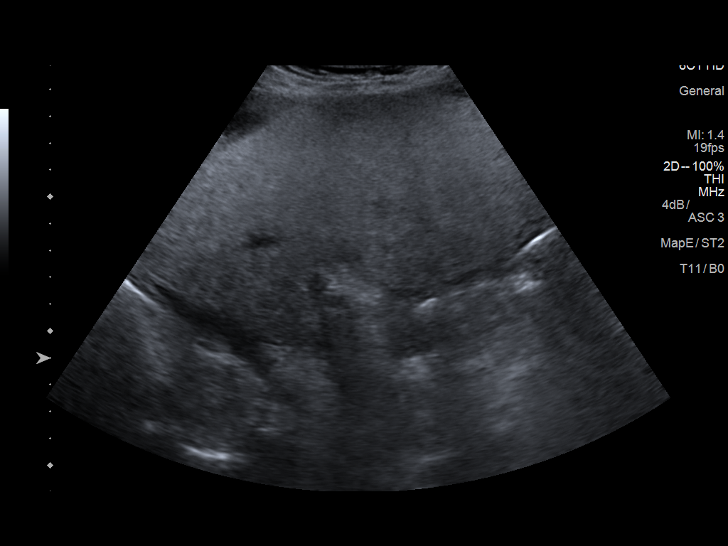
[im 17/66]
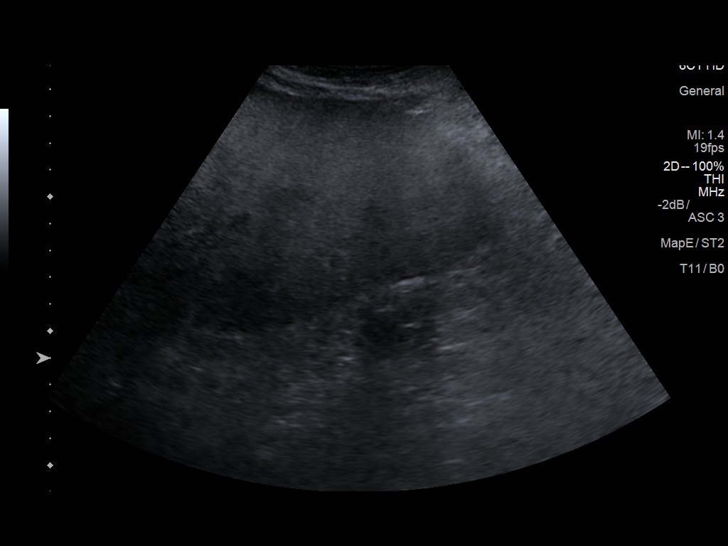
[im 22/66]
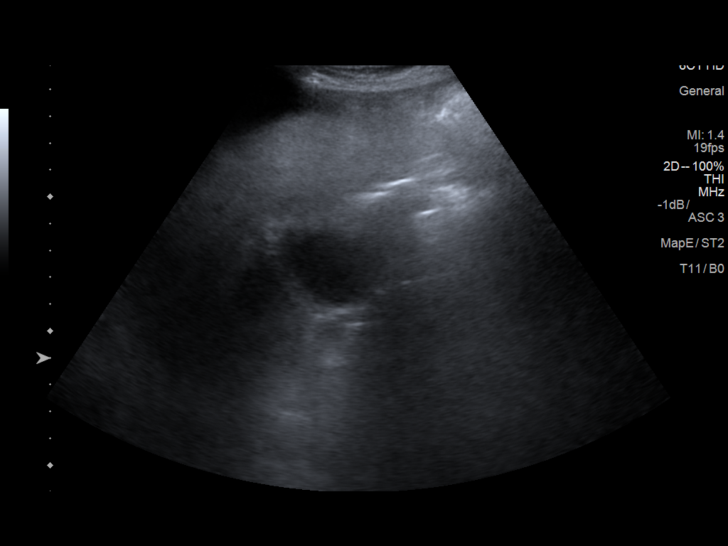
[im 25/66]
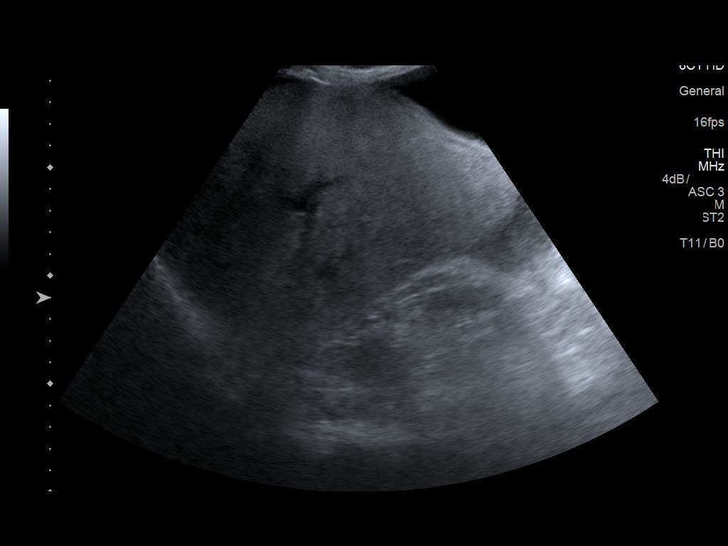
[im 30/66]
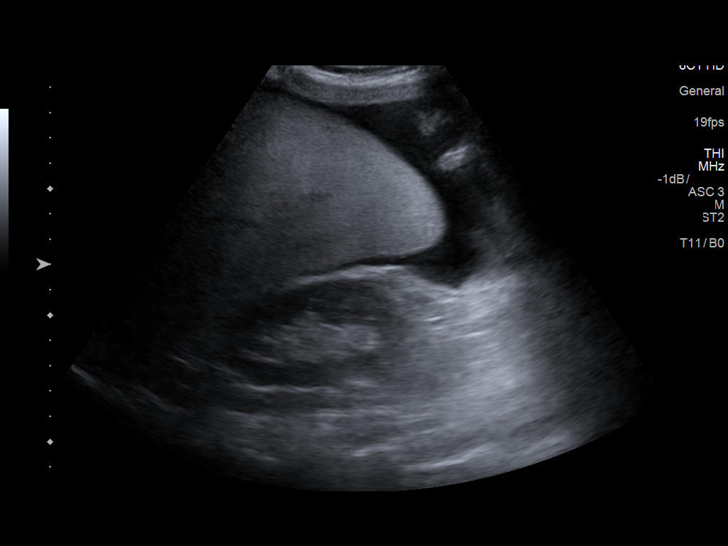
[im 36/66]
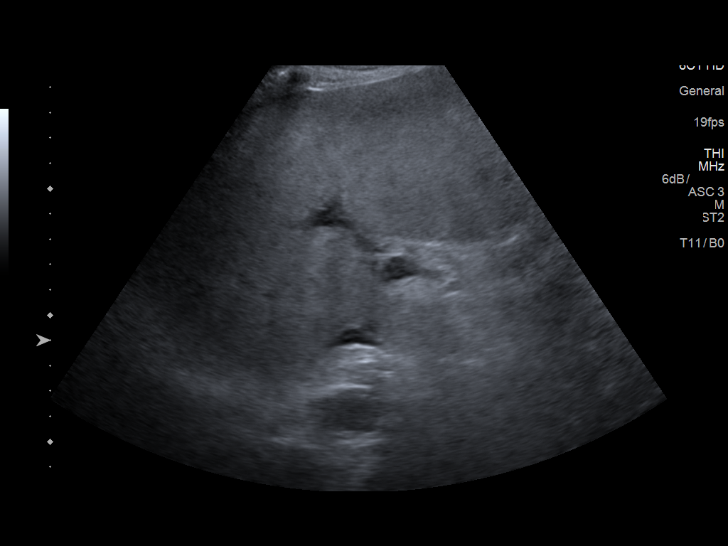
[im 41/66]
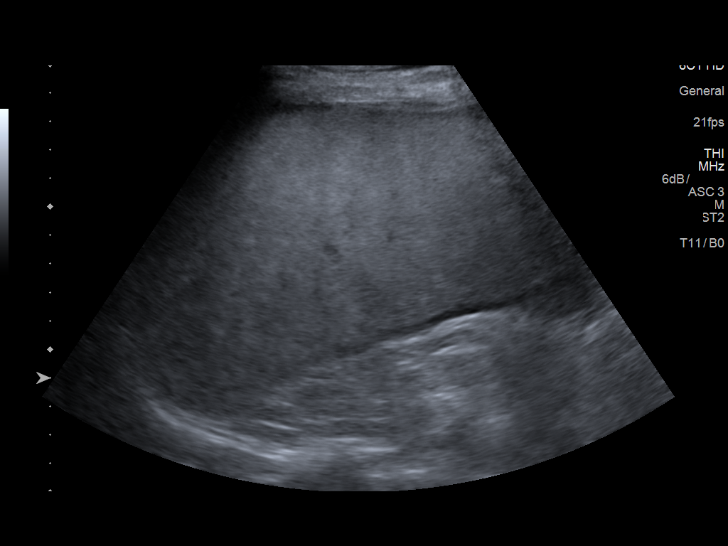
[im 44/66]
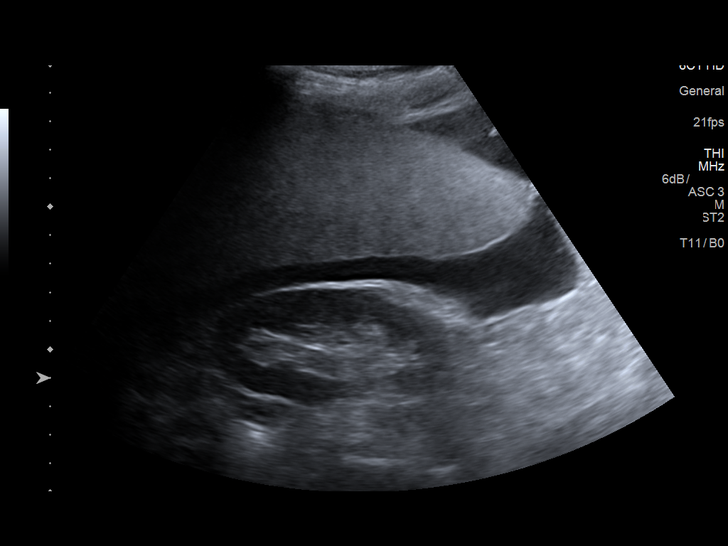
[im 49/66]
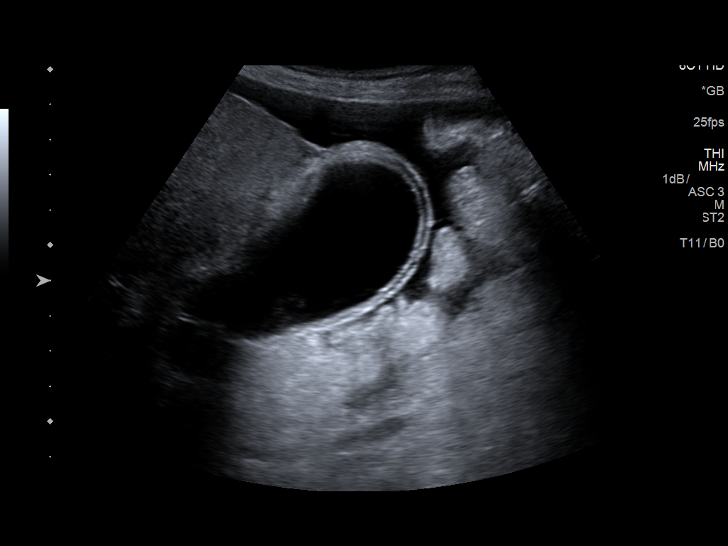
[im 55/66]
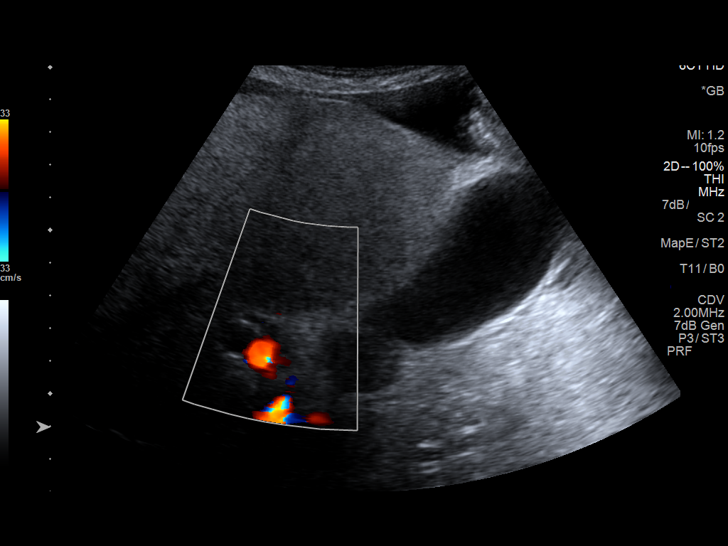
[im 60/66]
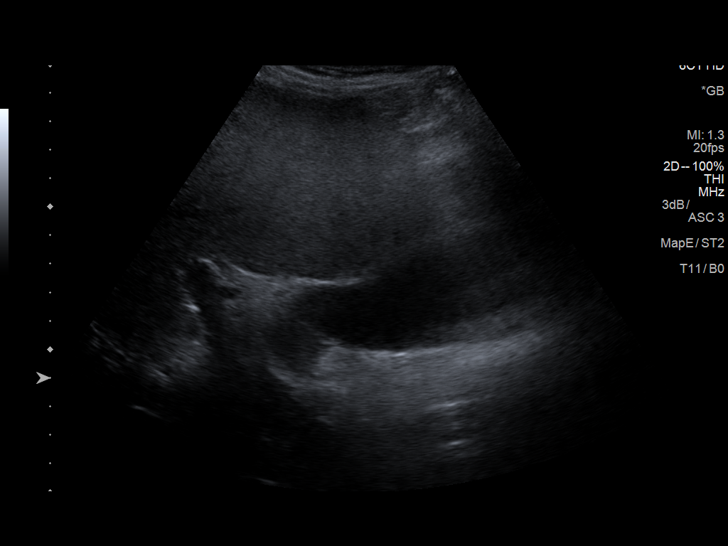
[im 66/66]
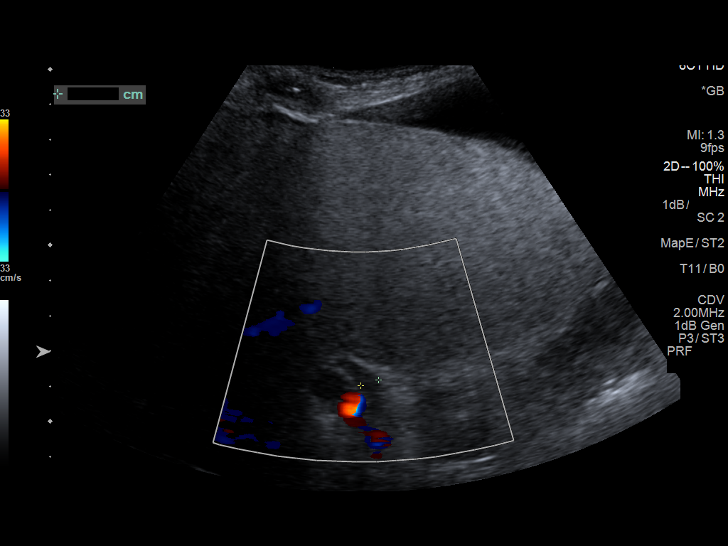

[14 of 25 positions shown; findings below may reference images not displayed]

FINDINGS: Gallbladder:

No gallstones are noted. Gallbladder wall is thickened at 4.7 mm,
which may be due to surrounding ascites. No sonographic Murphy's
sign is noted.

Common bile duct:

Diameter: 5.3 mm which is within normal limits.

Liver:

No focal abnormality is noted. Increased and coarsened echogenicity
of hepatic parenchyma is noted suggesting fatty infiltration or
diffuse hepatocellular disease.
IMPRESSION: No cholelithiasis is noted, but gallbladder wall thickening is noted
which may be due to surrounding ascites.

Increased and coarse echogenicity of hepatic parenchyma is noted
suggesting fatty infiltration or other diffuse hepatocellular
disease such as cirrhosis. No focal abnormality is noted.
